# Patient Record
Sex: Male | Born: 1975 | Race: Black or African American | Hispanic: No | Marital: Single | Smoking: Former smoker
Health system: Southern US, Community
[De-identification: ages and names within clinical notes are randomized; demographics above are authoritative.]

## PROBLEM LIST (undated history)

## (undated) DIAGNOSIS — R569 Unspecified convulsions: Secondary | ICD-10-CM

## (undated) DIAGNOSIS — I10 Essential (primary) hypertension: Secondary | ICD-10-CM

## (undated) DIAGNOSIS — E119 Type 2 diabetes mellitus without complications: Secondary | ICD-10-CM

## (undated) HISTORY — DX: Type 2 diabetes mellitus without complications: E11.9

## (undated) HISTORY — DX: Essential (primary) hypertension: I10

## (undated) HISTORY — DX: Unspecified convulsions: R56.9

---

## 2017-03-20 ENCOUNTER — Encounter (HOSPITAL_COMMUNITY): Payer: Self-pay

## 2017-03-20 ENCOUNTER — Emergency Department (HOSPITAL_COMMUNITY)
Admission: EM | Admit: 2017-03-20 | Discharge: 2017-03-20 | Disposition: A | Discharge status: Home / Self Care | Payer: Self-pay | Attending: Emergency Medicine | Admitting: Emergency Medicine

## 2017-03-20 ENCOUNTER — Emergency Department (HOSPITAL_COMMUNITY): Payer: Self-pay

## 2017-03-20 DIAGNOSIS — N50811 Right testicular pain: Secondary | ICD-10-CM | POA: Insufficient documentation

## 2017-03-20 DIAGNOSIS — I10 Essential (primary) hypertension: Secondary | ICD-10-CM | POA: Insufficient documentation

## 2017-03-20 DIAGNOSIS — R350 Frequency of micturition: Secondary | ICD-10-CM | POA: Insufficient documentation

## 2017-03-20 DIAGNOSIS — R739 Hyperglycemia, unspecified: Secondary | ICD-10-CM | POA: Insufficient documentation

## 2017-03-20 DIAGNOSIS — N50819 Testicular pain, unspecified: Secondary | ICD-10-CM

## 2017-03-20 LAB — COMPREHENSIVE METABOLIC PANEL
ALBUMIN: 4.3 g/dL (ref 3.5–5.0)
ALK PHOS: 106 U/L (ref 38–126)
ALT: 36 U/L (ref 17–63)
ANION GAP: 9 (ref 5–15)
AST: 26 U/L (ref 15–41)
BILIRUBIN TOTAL: 0.7 mg/dL (ref 0.3–1.2)
BUN: 18 mg/dL (ref 6–20)
CALCIUM: 9.8 mg/dL (ref 8.9–10.3)
CO2: 29 mmol/L (ref 22–32)
Chloride: 94 mmol/L — ABNORMAL LOW (ref 101–111)
Creatinine, Ser: 1.05 mg/dL (ref 0.61–1.24)
GFR calc Af Amer: >60 mL/min (ref >60)
GLUCOSE: 346 mg/dL — AB (ref 65–99)
POTASSIUM: 4.3 mmol/L (ref 3.5–5.1)
Sodium: 132 mmol/L — ABNORMAL LOW (ref 135–145)
TOTAL PROTEIN: 7.6 g/dL (ref 6.5–8.1)

## 2017-03-20 LAB — CBC WITH DIFFERENTIAL/PLATELET
BASOS ABS: 0 10*3/uL (ref 0.0–0.1)
BASOS PCT: 1 %
EOS PCT: 1 %
Eosinophils Absolute: 0.1 10*3/uL (ref 0.0–0.7)
HCT: 46.5 % (ref 39.0–52.0)
Hemoglobin: 16.5 g/dL (ref 13.0–17.0)
LYMPHS PCT: 35 %
Lymphs Abs: 3 10*3/uL (ref 0.7–4.0)
MCH: 28.9 pg (ref 26.0–34.0)
MCHC: 35.5 g/dL (ref 30.0–36.0)
MCV: 81.6 fL (ref 78.0–100.0)
MONO ABS: 0.6 10*3/uL (ref 0.1–1.0)
Monocytes Relative: 7 %
NEUTROS ABS: 4.9 10*3/uL (ref 1.7–7.7)
Neutrophils Relative %: 56 %
PLATELETS: 191 10*3/uL (ref 150–400)
RBC: 5.7 MIL/uL (ref 4.22–5.81)
RDW: 13.4 % (ref 11.5–15.5)
WBC: 8.7 10*3/uL (ref 4.0–10.5)

## 2017-03-20 LAB — URINALYSIS, ROUTINE W REFLEX MICROSCOPIC
Bacteria, UA: NONE SEEN
Bilirubin Urine: NEGATIVE
HGB URINE DIPSTICK: NEGATIVE
KETONES UR: NEGATIVE mg/dL
LEUKOCYTES UA: NEGATIVE
NITRITE: NEGATIVE
PH: 7 (ref 5.0–8.0)
PROTEIN: NEGATIVE mg/dL
SQUAMOUS EPITHELIAL / LPF: NONE SEEN
Specific Gravity, Urine: 1.026 (ref 1.005–1.030)
WBC, UA: NONE SEEN WBC/hpf (ref 0–5)

## 2017-03-20 LAB — CBG MONITORING, ED: Glucose-Capillary: 355 mg/dL — ABNORMAL HIGH (ref 65–99)

## 2017-03-20 MED ORDER — AMLODIPINE BESYLATE 5 MG PO TABS: 5.0000 mg | ORAL_TABLET | Freq: Every day | ORAL | 1 refills | Status: DC

## 2017-03-20 MED ORDER — SODIUM CHLORIDE 0.9 % IV BOLUS (SEPSIS)
1000.0000 mL | Freq: Once | INTRAVENOUS | Status: AC
  Administered 2017-03-20: 1000 mL via INTRAVENOUS

## 2017-03-20 MED ORDER — METFORMIN HCL 500 MG PO TABS: 500.0000 mg | ORAL_TABLET | Freq: Two times a day (BID) | ORAL | 1 refills | Status: DC

## 2017-03-20 MED ORDER — AMLODIPINE BESYLATE 5 MG PO TABS
5.0000 mg | ORAL_TABLET | Freq: Once | ORAL | Status: AC
  Administered 2017-03-20: 5 mg via ORAL
  Filled 2017-03-20: qty 1

## 2017-03-20 MED ORDER — METFORMIN HCL 500 MG PO TABS
500.0000 mg | ORAL_TABLET | Freq: Once | ORAL | Status: AC
  Administered 2017-03-20: 500 mg via ORAL
  Filled 2017-03-20: qty 1

## 2017-03-20 NOTE — ED Provider Notes (Signed)
[facility]-EM[facility]ENCY DEPT Provider Note   CSN: [id number] Arrival date & time: 03/20/17  1243     History   Chief Complaint Chief Complaint  Patient presents with  . Testicle Pain    HPI Marcus Arellano is a 41 y.o. male.  HPI Patient presents with right-sided testicle pain. Comes and goes. His crampy. States it hurts in the testicle and then to the tip of his penis. No discharge. No pain with urination. States he has however been urinating frequently. States he has to get up at night with it. Denies chance of STD. No fevers. No diarrhea or constipation. No chest pain.   History reviewed. No pertinent past medical history.  There are no active problems to display for this patient.   History reviewed. No pertinent surgical history.     Home Medications    Prior to Admission medications   Not on File    Family History No family history on file.  Social History Social History  Substance Use Topics  . Smoking status: Never Smoker  . Smokeless tobacco: Never Used  . Alcohol use No     Allergies   Patient has no known allergies.   Review of Systems Review of Systems  Constitutional: Negative for appetite change.  HENT: Negative for congestion.   Respiratory: Negative for shortness of breath.   Cardiovascular: Negative for chest pain.  Gastrointestinal: Negative for abdominal pain.  Endocrine: Positive for polyuria.  Genitourinary: Positive for frequency and testicular pain. Negative for decreased urine volume, difficulty urinating, penile swelling and scrotal swelling.  Musculoskeletal: Negative for back pain.  Skin: Negative for rash.  Neurological: Negative for weakness.  Psychiatric/Behavioral: Negative for confusion.     Physical Exam Updated Vital Signs BP (!) 157/112 (BP Location: Right Arm)   Pulse 97   Temp 98.5 F (36.9 C) (Oral)   Resp 16   Ht 5\' 10"  (1.778 m)   Wt 113.4 kg (250 lb)   SpO2 95%   BMI 35.87 kg/m   Physical Exam    Constitutional: He appears well-developed.  HENT:  Head: Atraumatic.  Eyes: Pupils are equal, round, and reactive to light.  Neck: Neck supple.  Cardiovascular: Normal rate.   Pulmonary/Chest: Effort normal.  Abdominal: There is no tenderness.  Genitourinary: Penis normal. No penile tenderness.  Genitourinary Comments: No testicular tenderness. Normal lie. No tenderness over epididymis. No hernia palpated. No penile discharge or lesion.  Musculoskeletal: Normal range of motion.  Neurological: He is alert.  Skin: Skin is warm. Capillary refill takes less than 2 seconds.     ED Treatments / Results  Labs (all labs ordered are listed, but only abnormal results are displayed) Labs Reviewed  URINALYSIS, ROUTINE W REFLEX MICROSCOPIC - Abnormal; Notable for the following:       Result Value   Color, Urine STRAW (*)    Glucose, UA >=500 (*)    All other components within normal limits  CBG MONITORING, ED - Abnormal; Notable for the following:    Glucose-Capillary 355 (*)    All other components within normal limits  COMPREHENSIVE METABOLIC PANEL  CBC WITH DIFFERENTIAL/PLATELET  HEMOGLOBIN A1C    EKG  EKG Interpretation None       Radiology No results found.  Procedures Procedures (including critical care time)  Medications Ordered in ED Medications  sodium chloride 0.9 % bolus 1,000 mL (not administered)     Initial Impression / Assessment and Plan / ED Course  I have reviewed the triage vital  signs and the nursing notes.  Pertinent labs & imaging results that were available during my care of the patient were reviewed by me and considered in my medical decision making (see chart for details).     Patient with testicle pain. No dysuria. Urine reassuring. Denies chance of STD. Ultrasound done to evaluate for torsion or infection but still pending. Also hyperglycemia. States she's been urinating frequently. Also hypertension. Will likely need primary care follow-up  and may require metformin restarted. Care will be turned over to Dr. Clayborne DanaMesner.  Final Clinical Impressions(s) / ED Diagnoses   Final diagnoses:  Testicular torsion  Hyperglycemia  Hypertension, unspecified type  Testicle pain    New Prescriptions New Prescriptions   No medications on file     Benjiman CorePickering, Dewitte Vannice, MD 03/20/17 1504

## 2017-03-20 NOTE — ED Triage Notes (Signed)
Patient reports of right testicle pain that radiates to tip of penis x2 days. Reports of urinary frequency.

## 2017-03-21 LAB — HEMOGLOBIN A1C
Hgb A1c MFr Bld: 10.7 % — ABNORMAL HIGH (ref 4.8–5.6)
MEAN PLASMA GLUCOSE: 260 mg/dL

## 2017-07-02 IMAGING — US US SCROTUM
1 series · 14 of 25 positions shown · non-contrast
Comparison: None.

CLINICAL DATA: Two days of right testicle pain.  Question torsion.

EXAM:
SCROTAL ULTRASOUND
DOPPLER ULTRASOUND OF THE TESTICLES
TECHNIQUE: Complete ultrasound examination of the testicles, epididymis, and
other scrotal structures was performed. Color and spectral Doppler
ultrasound were also utilized to evaluate blood flow to the
testicles.

[Series 1: us scrotum · 0.06mm/px · 14 of 77 slices shown]
[im 1/77]
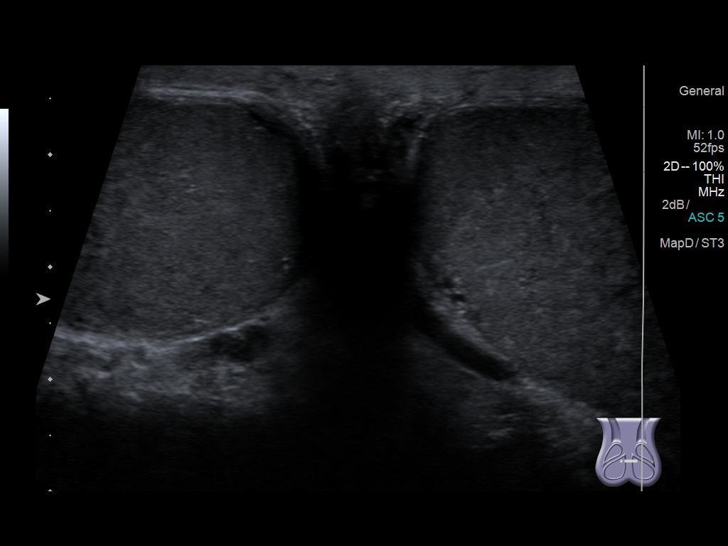
[im 7/77]
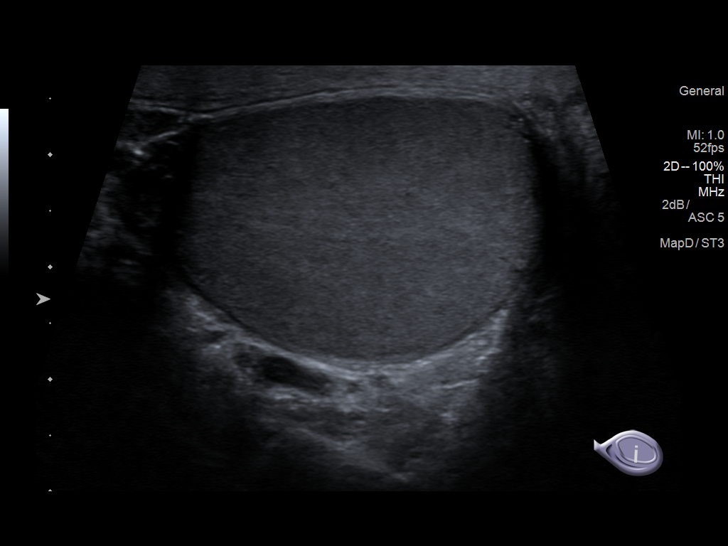
[im 13/77]
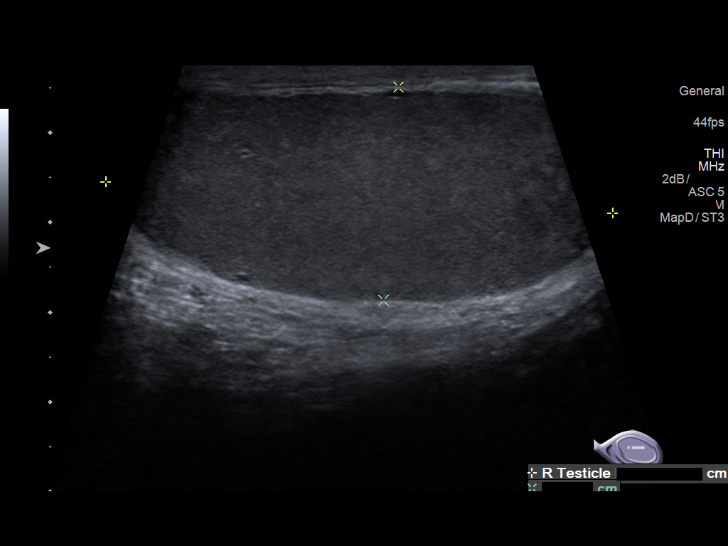
[im 20/77]
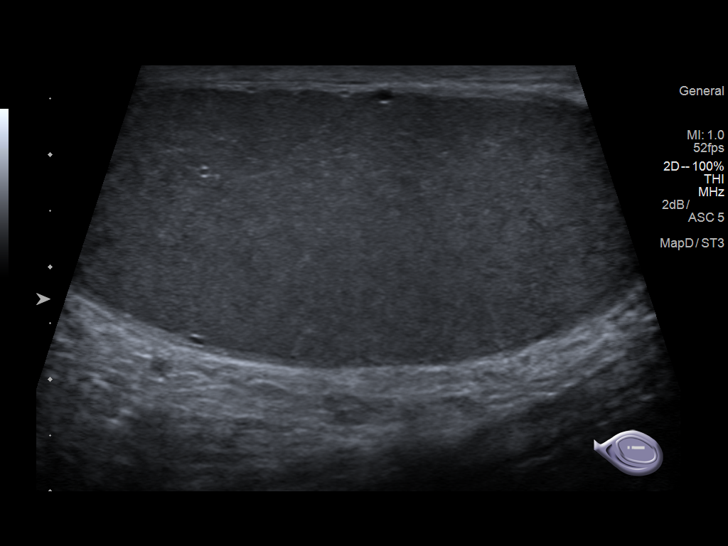
[im 26/77]
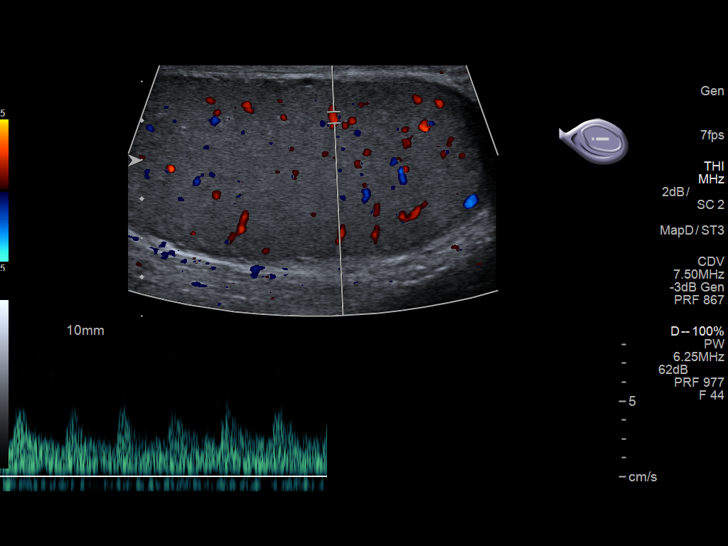
[im 29/77]
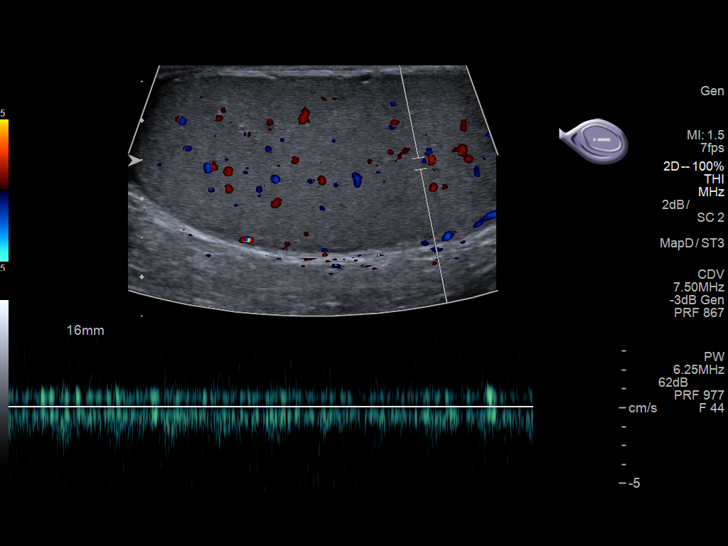
[im 35/77]
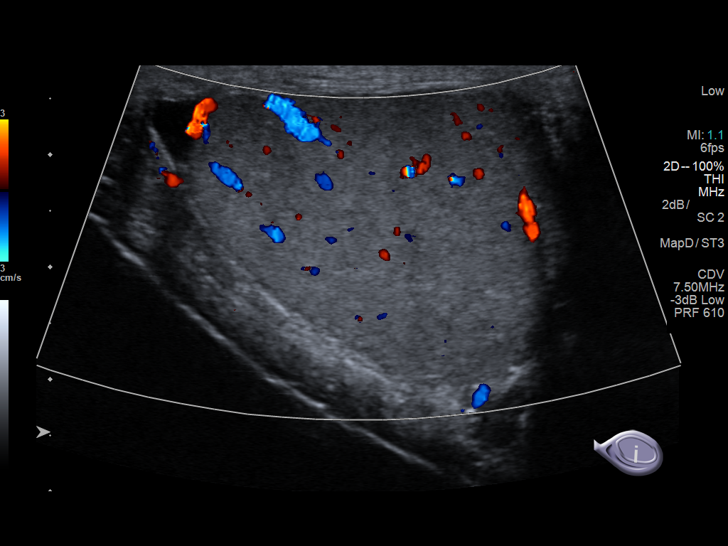
[im 42/77]
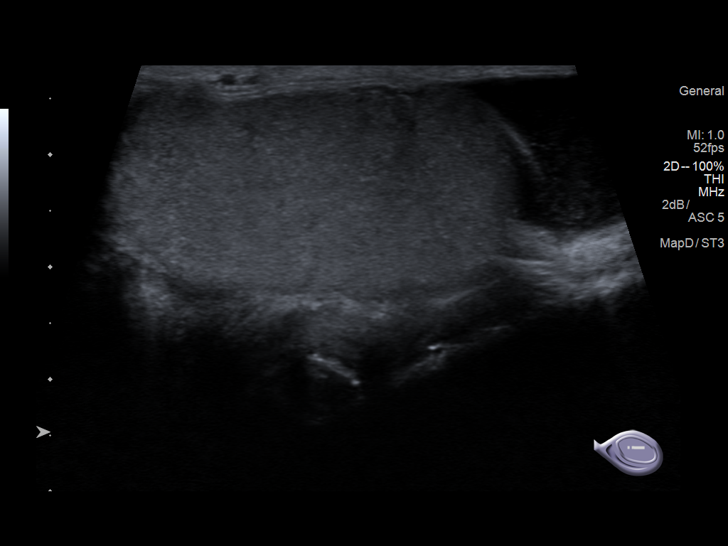
[im 48/77]
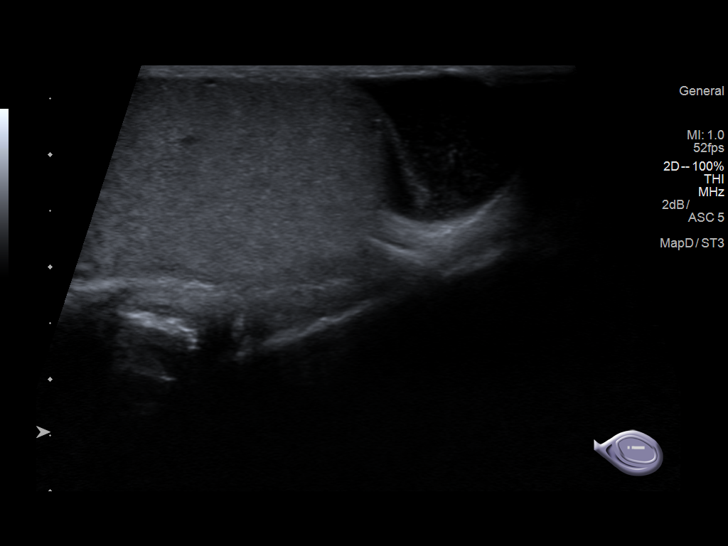
[im 51/77]
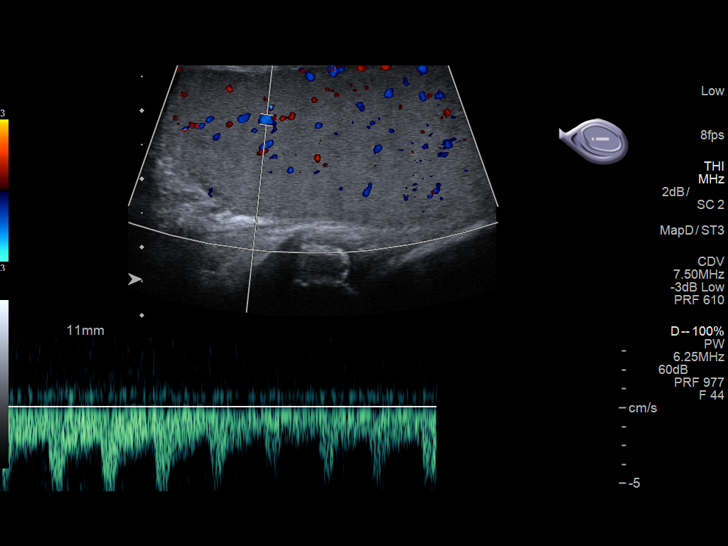
[im 58/77]
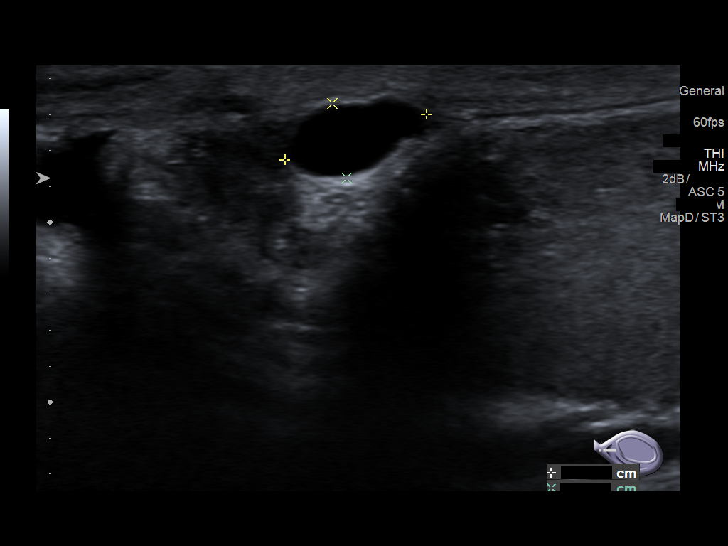
[im 64/77]
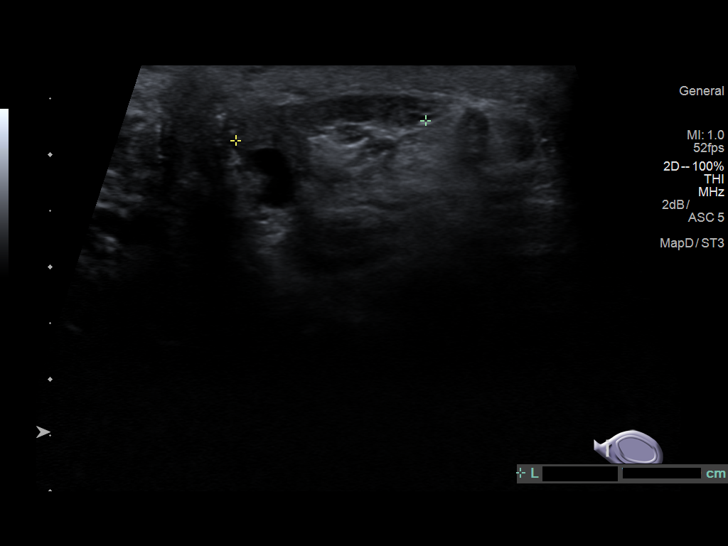
[im 70/77]
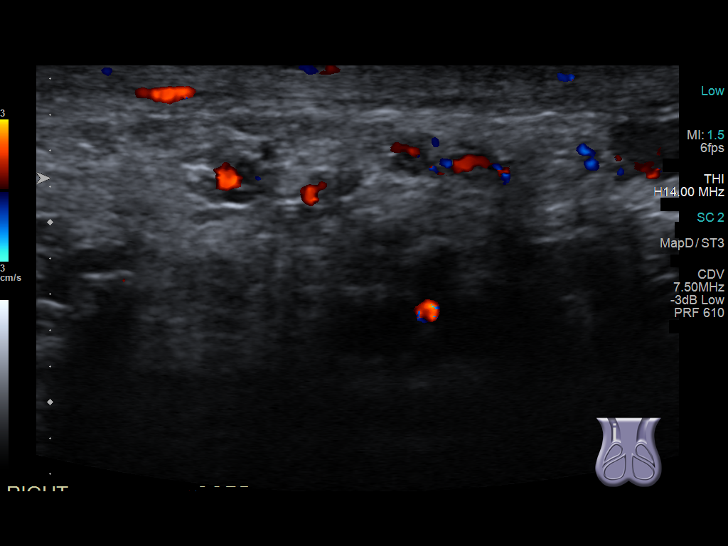
[im 77/77]
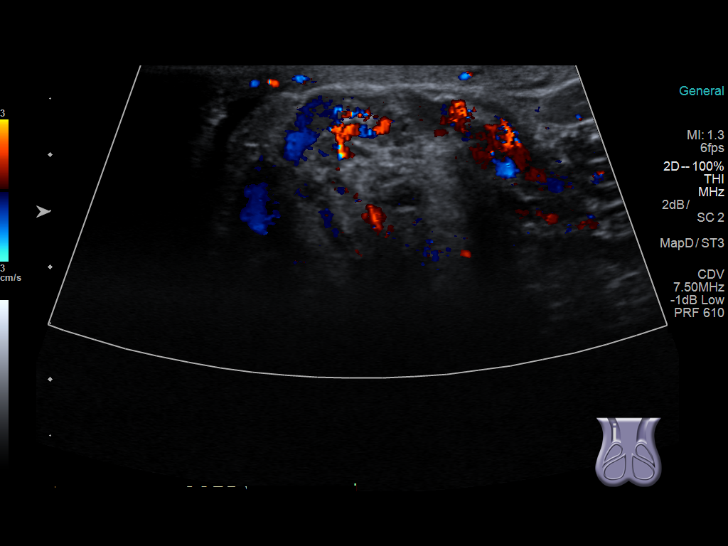

[14 of 25 positions shown; findings below may reference images not displayed]

FINDINGS: Pulsed Doppler interrogation of both testes demonstrates normal low
resistance arterial and venous waveforms bilaterally.

Measurements: 5.7 x 2.4 x 3.4 cm. No mass or microlithiasis
visualized.

Left testicle

Measurements: 5.4 x 2.4 x 3.3 cm. No mass or microlithiasis
visualized.

Right epididymis: Right-sided simple cyst measuring 8 mm. Otherwise
normal in size and appearance.

Left epididymis:  Normal in size and appearance.

Hydrocele:  Mild bilateral hydrocele.
IMPRESSION: Negative. No evidence for testicular torsion at this time or other
significant abnormality.

By: Tello Saleem M.D.

## 2017-08-07 DIAGNOSIS — E119 Type 2 diabetes mellitus without complications: Secondary | ICD-10-CM

## 2017-08-07 LAB — GLUCOSE, POCT (MANUAL RESULT ENTRY): POC Glucose: 288 mg/dl — AB (ref 70–99)

## 2017-08-07 LAB — POCT GLYCOSYLATED HEMOGLOBIN (HGB A1C): Hemoglobin A1C: 10.4

## 2017-08-07 NOTE — Congregational Nurse Program (Signed)
Congregational Nurse Program Note  Date of Encounter: 08/07/2017  Past Medical History: No past medical history on file.  Encounter Details: CNP Questionnaire - 08/07/17 1600      Questionnaire   Patient Status  Not Applicable    Race  Black or African American    Location Patient Served At  [facility]    Insurance  Not Applicable    Uninsured  Uninsured (NEW 1x/quarter)    Food  No food insecurities    Housing/Utilities  Yes, have permanent housing    Transportation  No transportation needs    Interpersonal Safety  Yes, feel physically and emotionally safe where you currently live    Medication  Provided medication assistance    Medical Provider  No    Referrals  [facility];Other;Primary Care Provider/Clinic    ED Visit Averted  Not Applicable    Life-Saving Intervention Made  Not Applicable       [facility]. Client is here today seeking help navigating into a primary care provider. He currently is unemployed and has no insurance. He lives with his sister and has for a year now. He was in the emergency room about 4 months ago and was treated for hypertension and diabetes with Metformin 500 mg one twice daily and amlodipine 5 mg one daily. He states he only had a month's worth and as been out of them for about 3 months. He states [place] to moving here from [place], he had a medical provider but he didn't follow up as he should.  No past medical history from client No past surgical history  Client alert and oriented to person and place. Answers questions appropriately. Does not appear anxious or nervous. Denies chest pains, shortness of breath or headaches. Denies any vision changes. States he has been feeling fine, however he does report being up frequently at night to urinate and increased hunger and thirst.  He denies abdominal pain. He states that he knows he needs to get back on medications.  Discussed with client options for primary  medical care and client would like a referral to the free clinic. Referral made and secured for 08/09/17 at 1030 am. RN also discussed with client using [facility] for a possible MD consult and refill of the medications he had been on previously. Client agreeable. [facility] session done with Dr Annia FriendlyBeard. Vitals and Point of care labs given to Dr. Annia FriendlyBeard along with medications he had been on and that the client will establish a primary medical provider with an upcoming appointment. Dr. Annia FriendlyBeard E-prescribed Metformin 500 mg po one tablet by mouth twice daily and amlodpine 5 mg tablet one by mouth daily. Client instructed to pick up medications today at [facility] on [address] in [place] and begin taking them today.  Client states understanding. One time assistance voucher faxed to [facility] to assist in securing medications.  RN counseled client on decreasing salt intake as well as information regarding a diabetic diet. Handouts given regarding low salt diet, diabetes, my plate example. Tips on stroke prevention and healthy lifestyle and diet.  Will follow up with client after his initial primary care appointment and for further needs. Client has contact information for the free clinic as well as RN and [facility].

## 2017-08-09 ENCOUNTER — Ambulatory Visit: Payer: Self-pay | Admitting: Physician Assistant

## 2017-08-09 ENCOUNTER — Encounter: Payer: Self-pay | Admitting: Physician Assistant

## 2017-08-09 ENCOUNTER — Other Ambulatory Visit: Payer: Self-pay | Admitting: Physician Assistant

## 2017-08-09 VITALS — BP 146/118 | HR 90 | Temp 97.3°F | Ht 69.25 in | Wt 260.5 lb

## 2017-08-09 DIAGNOSIS — I10 Essential (primary) hypertension: Secondary | ICD-10-CM | POA: Insufficient documentation

## 2017-08-09 DIAGNOSIS — R079 Chest pain, unspecified: Secondary | ICD-10-CM

## 2017-08-09 DIAGNOSIS — E669 Obesity, unspecified: Secondary | ICD-10-CM

## 2017-08-09 DIAGNOSIS — E1165 Type 2 diabetes mellitus with hyperglycemia: Secondary | ICD-10-CM | POA: Insufficient documentation

## 2017-08-09 DIAGNOSIS — Z13220 Encounter for screening for lipoid disorders: Secondary | ICD-10-CM

## 2017-08-09 MED ORDER — METOPROLOL TARTRATE 25 MG PO TABS: 25.0000 mg | ORAL_TABLET | Freq: Two times a day (BID) | ORAL | 1 refills | Status: DC

## 2017-08-09 MED ORDER — LISINOPRIL 10 MG PO TABS: 10.0000 mg | ORAL_TABLET | Freq: Every day | ORAL | 1 refills | Status: DC

## 2017-08-09 NOTE — Progress Notes (Signed)
BP (!) 146/118 (BP Location: Left Arm, Patient Position: Sitting, Cuff Size: Large)   Pulse 90   Temp (!) 97.3 F (36.3 C)   Ht 5' 9.25" (1.759 m)   Wt 260 lb 8 oz (118.2 kg)   SpO2 98%   BMI 38.19 kg/m    Subjective:    Patient ID: Marcus Arellano, male    DOB: 1976-06-01, 41 y.o.   MRN: [id number]  HPI: Marcus Arellano is a 41 y.o. male presenting on 08/09/2017 for New Patient (Initial Visit) (pt has not seen provide in a long time. [facility])   HPI  Chief Complaint  Patient presents with  . New Patient (Initial Visit)    pt has not seen provide in a long time. pt got meds from [facility]    Pt moved to the area about a year ago and no PCP since then  He was Just dx with DM and htn this summer in the ER.  a1c recently 10.4.   He sometimes gets CP with exertion.  No CP at rest.  He gets the pain maybe once/week. It lasts about 5 min but goes away when he rests.  No associated sob or nausea.      He is sweaty all the time, not just with CP.  He has a family history of CAD    Relevant past medical, surgical, family and social history reviewed and updated as indicated. Interim medical history since our last visit reviewed. Allergies and medications reviewed and updated.   Current Outpatient Medications:  .  amLODipine (NORVASC) 5 MG tablet, Take 1 tablet (5 mg total) by mouth daily., Disp: 30 tablet, Rfl: 1 .  metFORMIN (GLUCOPHAGE) 500 MG tablet, Take 1 tablet (500 mg total) by mouth 2 (two) times daily with a meal., Disp: 60 tablet, Rfl: 1   Review of Systems  Constitutional: Positive for diaphoresis. Negative for appetite change, chills, fatigue, fever and unexpected weight change.  HENT: Positive for dental problem. Negative for congestion, drooling, ear pain, facial swelling, hearing loss, mouth sores, sneezing, sore throat, trouble swallowing and voice change.   Eyes: Negative for pain, discharge, redness, itching and visual disturbance.   Respiratory: Negative for cough, choking, shortness of breath and wheezing.   Cardiovascular: Positive for chest pain and leg swelling. Negative for palpitations.  Gastrointestinal: Negative for abdominal pain, blood in stool, constipation, diarrhea and vomiting.  Endocrine: Negative for cold intolerance, heat intolerance and polydipsia.  Genitourinary: Negative for decreased urine volume, dysuria and hematuria.  Musculoskeletal: Negative for arthralgias, back pain and gait problem.  Skin: Negative for rash.  Allergic/Immunologic: Negative for environmental allergies.  Neurological: Positive for light-headedness and headaches. Negative for seizures and syncope.  Hematological: Negative for adenopathy.  Psychiatric/Behavioral: Positive for dysphoric mood. Negative for agitation and suicidal ideas. The patient is not nervous/anxious.     Per HPI unless specifically indicated above     Objective:    BP (!) 146/118 (BP Location: Left Arm, Patient Position: Sitting, Cuff Size: Large)   Pulse 90   Temp (!) 97.3 F (36.3 C)   Ht 5' 9.25" (1.759 m)   Wt 260 lb 8 oz (118.2 kg)   SpO2 98%   BMI 38.19 kg/m   Wt Readings from Last 3 Encounters:  08/09/17 260 lb 8 oz (118.2 kg)  08/07/17 261 lb (118.4 kg)  03/20/17 250 lb (113.4 kg)    Physical Exam  Constitutional: He is oriented to person, place,  and time. He appears well-developed and well-nourished.  HENT:  Head: Normocephalic and atraumatic.  Mouth/Throat: Oropharynx is clear and moist. No oropharyngeal exudate.  Eyes: Conjunctivae and EOM are normal. Pupils are equal, round, and reactive to light.  Neck: Neck supple. No thyromegaly present.  Cardiovascular: Normal rate and regular rhythm.  Pulmonary/Chest: Effort normal and breath sounds normal. He has no wheezes. He has no rales.  Abdominal: Soft. Bowel sounds are normal. He exhibits no mass. There is no hepatosplenomegaly. There is no tenderness.  Musculoskeletal: He exhibits  no edema.  Lymphadenopathy:    He has no cervical adenopathy.  Neurological: He is alert and oriented to person, place, and time.  Skin: Skin is warm and dry. No rash noted.  Psychiatric: He has a normal mood and affect. His behavior is normal. Thought content normal.  Vitals reviewed.    EKG- NSR at 89 bpm.  No st-t changes. No previous for comparison  Results for orders placed or performed in visit on 08/07/17  POCT glucose  Result Value Ref Range   POC Glucose 288 (A) 70 - 99 mg/dl  POCT [id number]  Result Value Ref Range   Hemoglobin A1C 10.4       Assessment & Plan:   Encounter Diagnoses  Name Primary?  Marland Kitchen. Uncontrolled type 2 diabetes mellitus with hyperglycemia (HCC) Yes  . Essential hypertension   . Chest pain, unspecified type   . Screening cholesterol level   . Obesity, unspecified classification, unspecified obesity type, unspecified whether serious comorbidity present     -pt to get fasting labs -pt to Go to DM education class -will add [place].  Samples are given in office today -pt is signed up for [facility] (to get the [place]) -pt will continue the metformin -will Add lisinopril and metoprolol for bp and discontinue the amlodipine -pt is to start low-dose aspirin daily -will Refer to cardiology for evaluation of CP -pt is given [facility] application -pt will follow up here in 1 month.  RTO sooner prn

## 2017-08-09 NOTE — Patient Instructions (Addendum)
-  get labs/blood drawn -go to diabetes education class -stop amlodipine -start lisinopril and metoprolol (BP) -continue metformin (DM) -januvia (DM) will come in the mail -start daily 81 mg aspirin -[facility] in [facility] application

## 2017-08-10 ENCOUNTER — Other Ambulatory Visit (HOSPITAL_COMMUNITY)
Admission: RE | Admit: 2017-08-10 | Discharge: 2017-08-10 | Disposition: A | Discharge status: Home / Self Care | Payer: Self-pay | Source: Ambulatory Visit | Attending: Physician Assistant | Admitting: Physician Assistant

## 2017-08-10 DIAGNOSIS — I10 Essential (primary) hypertension: Secondary | ICD-10-CM | POA: Insufficient documentation

## 2017-08-10 DIAGNOSIS — Z13220 Encounter for screening for lipoid disorders: Secondary | ICD-10-CM | POA: Insufficient documentation

## 2017-08-10 DIAGNOSIS — R079 Chest pain, unspecified: Secondary | ICD-10-CM | POA: Insufficient documentation

## 2017-08-10 DIAGNOSIS — E1165 Type 2 diabetes mellitus with hyperglycemia: Secondary | ICD-10-CM | POA: Insufficient documentation

## 2017-08-10 LAB — COMPREHENSIVE METABOLIC PANEL
ALBUMIN: 3.9 g/dL (ref 3.5–5.0)
ALK PHOS: 95 U/L (ref 38–126)
ALT: 25 U/L (ref 17–63)
AST: 26 U/L (ref 15–41)
Anion gap: 7 (ref 5–15)
BUN: 16 mg/dL (ref 6–20)
CHLORIDE: 98 mmol/L — AB (ref 101–111)
CO2: 28 mmol/L (ref 22–32)
CREATININE: 0.94 mg/dL (ref 0.61–1.24)
Calcium: 9 mg/dL (ref 8.9–10.3)
GFR calc Af Amer: >60 mL/min (ref >60)
Glucose, Bld: 240 mg/dL — ABNORMAL HIGH (ref 65–99)
Potassium: 4.1 mmol/L (ref 3.5–5.1)
Sodium: 133 mmol/L — ABNORMAL LOW (ref 135–145)
Total Bilirubin: 0.6 mg/dL (ref 0.3–1.2)
Total Protein: 7.4 g/dL (ref 6.5–8.1)

## 2017-08-10 LAB — HEMOGLOBIN AND HEMATOCRIT, BLOOD
HCT: 46.1 % (ref 39.0–52.0)
HEMOGLOBIN: 16.1 g/dL (ref 13.0–17.0)

## 2017-08-10 LAB — LIPID PANEL
Cholesterol: 204 mg/dL — ABNORMAL HIGH (ref 0–200)
HDL: 34 mg/dL — AB (ref >40)
LDL CALC: 146 mg/dL — AB (ref 0–99)
TRIGLYCERIDES: 119 mg/dL (ref <150)
Total CHOL/HDL Ratio: 6 RATIO
VLDL: 24 mg/dL (ref 0–40)

## 2017-08-11 LAB — MICROALBUMIN, URINE: Microalb, Ur: 221.9 ug/mL — ABNORMAL HIGH

## 2017-08-21 ENCOUNTER — Telehealth: Payer: Self-pay

## 2017-08-21 NOTE — Telephone Encounter (Signed)
[facility] on 08/07/2017 for assistance navigating into a healthcare system. Pt opted for the [facility] and an appointment was secured for 08/09/2017 at 10:30 am.   Pt was called today for follow-up. Pt stated he is doing good and was able to make it to his appointment and has also been to get his blood drawn for his lab work.  Pt appreciative of the call.   Marcus Blanda R. DeSotoRangel, LPN [phone]

## 2017-09-06 ENCOUNTER — Ambulatory Visit: Payer: Self-pay | Admitting: Physician Assistant

## 2017-09-10 ENCOUNTER — Ambulatory Visit: Payer: Self-pay | Admitting: Physician Assistant

## 2017-09-11 ENCOUNTER — Ambulatory Visit: Payer: Self-pay | Admitting: Physician Assistant

## 2017-09-11 ENCOUNTER — Encounter: Payer: Self-pay | Admitting: Physician Assistant

## 2017-09-11 VITALS — BP 150/100 | HR 101 | Ht 69.25 in | Wt 260.5 lb

## 2017-09-11 DIAGNOSIS — E669 Obesity, unspecified: Secondary | ICD-10-CM

## 2017-09-11 DIAGNOSIS — Z9119 Patient's noncompliance with other medical treatment and regimen: Secondary | ICD-10-CM

## 2017-09-11 DIAGNOSIS — E785 Hyperlipidemia, unspecified: Secondary | ICD-10-CM | POA: Insufficient documentation

## 2017-09-11 DIAGNOSIS — Z91199 Patient's noncompliance with other medical treatment and regimen due to unspecified reason: Secondary | ICD-10-CM

## 2017-09-11 DIAGNOSIS — E1165 Type 2 diabetes mellitus with hyperglycemia: Secondary | ICD-10-CM

## 2017-09-11 DIAGNOSIS — I10 Essential (primary) hypertension: Secondary | ICD-10-CM

## 2017-09-11 MED ORDER — LISINOPRIL 10 MG PO TABS: 10.0000 mg | ORAL_TABLET | Freq: Every day | ORAL | 1 refills | Status: DC

## 2017-09-11 MED ORDER — ATORVASTATIN CALCIUM 20 MG PO TABS: 20.0000 mg | ORAL_TABLET | Freq: Every day | ORAL | 3 refills | Status: DC

## 2017-09-11 MED ORDER — METFORMIN HCL 500 MG PO TABS: 500.0000 mg | ORAL_TABLET | Freq: Two times a day (BID) | ORAL | 1 refills | Status: DC

## 2017-09-11 MED ORDER — METOPROLOL TARTRATE 25 MG PO TABS: 25.0000 mg | ORAL_TABLET | Freq: Two times a day (BID) | ORAL | 1 refills | Status: DC

## 2017-09-11 NOTE — Patient Instructions (Signed)
Fat and Cholesterol Restricted Diet High levels of fat and cholesterol in your blood may lead to various health problems, such as diseases of the heart, blood vessels, gallbladder, liver, and pancreas. Fats are concentrated sources of energy that come in various forms. Certain types of fat, including saturated fat, may be harmful in excess. Cholesterol is a substance needed by your body in small amounts. Your body makes all the cholesterol it needs. Excess cholesterol comes from the food you eat. When you have high levels of cholesterol and saturated fat in your blood, health problems can develop because the excess fat and cholesterol will gather along the walls of your blood vessels, causing them to narrow. Choosing the right foods will help you control your intake of fat and cholesterol. This will help keep the levels of these substances in your blood within normal limits and reduce your risk of disease. What is my plan? Your health care provider recommends that you:  Limit your fat intake to ______% or less of your total calories per day.  Limit the amount of cholesterol in your diet to less than _________mg per day.  Eat 20-30 grams of fiber each day.  What types of fat should I choose?  Choose healthy fats more often. Choose monounsaturated and polyunsaturated fats, such as olive and canola oil, flaxseeds, walnuts, almonds, and seeds.  Eat more omega-3 fats. Good choices include salmon, mackerel, sardines, tuna, flaxseed oil, and ground flaxseeds. Aim to eat fish at least two times a week.  Limit saturated fats. Saturated fats are primarily found in animal products, such as meats, butter, and cream. Plant sources of saturated fats include palm oil, palm kernel oil, and coconut oil.  Avoid foods with partially hydrogenated oils in them. These contain trans fats. Examples of foods that contain trans fats are stick margarine, some tub margarines, cookies, crackers, and other baked goods. What  general guidelines do I need to follow? These guidelines for healthy eating will help you control your intake of fat and cholesterol:  Check food labels carefully to identify foods with trans fats or high amounts of saturated fat.  Fill one half of your plate with vegetables and green salads.  Fill one fourth of your plate with whole grains. Look for the word "whole" as the first word in the ingredient list.  Fill one fourth of your plate with lean protein foods.  Limit fruit to two servings a day. Choose fruit instead of juice.  Eat more foods that contain fiber, such as apples, broccoli, carrots, beans, peas, and barley.  Eat more home-cooked food and less restaurant, buffet, and fast food.  Limit or avoid alcohol.  Limit foods high in starch and sugar.  Limit fried foods.  Cook foods using methods other than frying. Baking, boiling, grilling, and broiling are all great options.  Lose weight if you are overweight. Losing just 5-10% of your initial body weight can help your overall health and prevent diseases such as diabetes and heart disease.  What foods can I eat? Grains  Whole grains, such as whole wheat or whole grain breads, crackers, cereals, and pasta. Unsweetened oatmeal, bulgur, barley, quinoa, or [place] rice. Corn or whole wheat flour tortillas. Vegetables  Fresh or frozen vegetables (raw, steamed, roasted, or grilled). Green salads. Fruits  All fresh, canned (in natural juice), or frozen fruits. Meats and other protein foods  Ground beef (85% or leaner), grass-fed beef, or beef trimmed of fat. Skinless chicken or turkey. Ground chicken or turkey.   Pork trimmed of fat. All fish and seafood. Eggs. Dried beans, peas, or lentils. Unsalted nuts or seeds. Unsalted canned or dry beans. Dairy  Low-fat dairy products, such as skim or 1% milk, 2% or reduced-fat cheeses, low-fat ricotta or cottage cheese, or plain low-fat yo Fats and oils  Tub margarines without trans  fats. Light or reduced-fat mayonnaise and salad dressings. Avocado. Olive, canola, sesame, or safflower oils. Natural peanut or almond butter (choose ones without added sugar and oil). The items listed above may not be a complete list of recommended foods or beverages. Contact your dietitian for more options. Foods to avoid Grains  White bread. White pasta. White rice. Cornbread. Bagels, pastries, and croissants. Crackers that contain trans fat. Vegetables  White potatoes. Corn. Creamed or fried vegetables. Vegetables in a cheese sauce. Fruits  Dried fruits. Canned fruit in light or heavy syrup. Fruit juice. Meats and other protein foods  Fatty cuts of meat. Ribs, chicken wings, bacon, sausage, bologna, salami, chitterlings, fatback, hot dogs, bratwurst, and packaged luncheon meats. Liver and organ meats. Dairy  Whole or 2% milk, cream, half-and-half, and cream cheese. Whole milk cheeses. Whole-fat or sweetened yogurt. Full-fat cheeses. Nondairy creamers and whipped toppings. Processed cheese, cheese spreads, or cheese curds. Beverages  Alcohol. Sweetened drinks (such as sodas, lemonade, and fruit drinks or punches). Fats and oils  Butter, stick margarine, lard, shortening, ghee, or bacon fat. Coconut, palm kernel, or palm oils. Sweets and desserts  Corn syrup, sugars, honey, and molasses. Candy. Jam and jelly. Syrup. Sweetened cereals. Cookies, pies, cakes, donuts, muffins, and ice cream. The items listed above may not be a complete list of foods and beverages to avoid. Contact your dietitian for more information. This information is not intended to replace advice given to you by your health care provider. Make sure you discuss any questions you have with your health care provider. Document Released: 09/18/2005 Document Revised: 10/09/2014 Document Reviewed: 12/17/2013 Elsevier Interactive Patient Education  2017 [employer]  

## 2017-09-11 NOTE — Progress Notes (Signed)
BP (!) 150/100 (BP Location: Left Arm, Patient Position: Sitting, Cuff Size: Large)   Pulse (!) 101   Ht 5' 9.25" (1.759 m)   Wt 260 lb 8 oz (118.2 kg)   SpO2 94%   BMI 38.19 kg/m    Subjective:    Patient ID: Marcus Arellano, male    DOB: 1975-11-15, 41 y.o.   MRN: [id number]  HPI: Marcus Arellano is a 41 y.o. male presenting on 09/11/2017 for Follow-up   HPI   Pt is out of  his lisinopril-   Pt checks his bs at home- a few times it was 280, once it was 300.  On a good day it is around 160-180 range.   Pt has not yet been to the diabetic education class.    Pt says he has not had any episodes of CP since last OV.   Pt has appt with cardiology next week.   He says he is supposed to go for training on next week.   Encouraged him to reschedule (and not just no-show)  Pt did not turn in his [employer] discount application.    Relevant past medical, surgical, family and social history reviewed and updated as indicated. Interim medical history since our last visit reviewed. Allergies and medications reviewed and updated.   Current Outpatient Medications:  .  metFORMIN (GLUCOPHAGE) 500 MG tablet, Take 1 tablet (500 mg total) by mouth 2 (two) times daily with a meal., Disp: 60 tablet, Rfl: 1 .  metoprolol tartrate (LOPRESSOR) 25 MG tablet, Take 1 tablet (25 mg total) 2 (two) times daily by mouth., Disp: 60 tablet, Rfl: 1 .  BABY ASPIRIN PO, Take by mouth., Disp: , Rfl:  .  lisinopril (PRINIVIL,ZESTRIL) 10 MG tablet, Take 1 tablet (10 mg total) daily by mouth. (Patient not taking: Reported on 09/11/2017), Disp: 30 tablet, Rfl: 1 .  sitaGLIPtin (JANUVIA) 100 MG tablet, Take 100 mg by mouth daily., Disp: , Rfl:    Review of Systems  Constitutional: Negative for appetite change, chills, diaphoresis, fatigue, fever and unexpected weight change.  HENT: Negative for congestion, dental problem, drooling, ear pain, facial swelling, hearing loss, mouth sores, sneezing, sore throat, trouble  swallowing and voice change.   Eyes: Negative for pain, discharge, redness, itching and visual disturbance.  Respiratory: Negative for cough, choking, shortness of breath and wheezing.   Cardiovascular: Negative for chest pain, palpitations and leg swelling.  Gastrointestinal: Negative for abdominal pain, blood in stool, constipation, diarrhea and vomiting.  Endocrine: Negative for cold intolerance, heat intolerance and polydipsia.  Genitourinary: Negative for decreased urine volume, dysuria and hematuria.  Musculoskeletal: Negative for arthralgias, back pain and gait problem.  Skin: Negative for rash.  Allergic/Immunologic: Negative for environmental allergies.  Neurological: Positive for headaches. Negative for seizures, syncope and light-headedness.  Hematological: Negative for adenopathy.  Psychiatric/Behavioral: Negative for agitation, dysphoric mood and suicidal ideas. The patient is not nervous/anxious.     Per HPI unless specifically indicated above     Objective:    BP (!) 150/100 (BP Location: Left Arm, Patient Position: Sitting, Cuff Size: Large)   Pulse (!) 101   Ht 5' 9.25" (1.759 m)   Wt 260 lb 8 oz (118.2 kg)   SpO2 94%   BMI 38.19 kg/m   Wt Readings from Last 3 Encounters:  09/11/17 260 lb 8 oz (118.2 kg)  08/09/17 260 lb 8 oz (118.2 kg)  08/07/17 261 lb (118.4 kg)    Physical Exam  Constitutional: He is oriented  to person, place, and time. He appears well-developed and well-nourished.  HENT:  Head: Normocephalic and atraumatic.  Neck: Neck supple.  Cardiovascular: Normal rate and regular rhythm.  Pulmonary/Chest: Effort normal and breath sounds normal. He has no wheezes.  Abdominal: Soft. Bowel sounds are normal. There is no hepatosplenomegaly. There is no tenderness.  Musculoskeletal: He exhibits no edema.  Lymphadenopathy:    He has no cervical adenopathy.  Neurological: He is alert and oriented to person, place, and time.  Skin: Skin is warm and dry.   Psychiatric: He has a normal mood and affect. His behavior is normal.  Vitals reviewed.   Results for orders placed or performed during the hospital encounter of 08/10/17  Microalbumin, urine  Result Value Ref Range   Microalb, Ur 221.9 (H) Not Estab. ug/mL  Lipid panel  Result Value Ref Range   Cholesterol 204 (H) 0 - 200 mg/dL   Triglycerides 130119 <865<150 mg/dL   HDL 34 (L) >78>40 mg/dL   Total CHOL/HDL Ratio 6.0 RATIO   VLDL 24 0 - 40 mg/dL   LDL Cholesterol 469146 (H) 0 - 99 mg/dL  Hemoglobin and hematocrit, blood  Result Value Ref Range   Hemoglobin 16.1 13.0 - 17.0 g/dL   HCT 62.946.1 52.839.0 - 41.352.0 %  Comprehensive metabolic panel  Result Value Ref Range   Sodium 133 (L) 135 - 145 mmol/L   Potassium 4.1 3.5 - 5.1 mmol/L   Chloride 98 (L) 101 - 111 mmol/L   CO2 28 22 - 32 mmol/L   Glucose, Bld 240 (H) 65 - 99 mg/dL   BUN 16 6 - 20 mg/dL   Creatinine, Ser 2.440.94 0.61 - 1.24 mg/dL   Calcium 9.0 8.9 - 01.010.3 mg/dL   Total Protein 7.4 6.5 - 8.1 g/dL   Albumin 3.9 3.5 - 5.0 g/dL   AST 26 15 - 41 U/L   ALT 25 17 - 63 U/L   Alkaline Phosphatase 95 38 - 126 U/L   Total Bilirubin 0.6 0.3 - 1.2 mg/dL   GFR calc non Af Amer >60 >60 mL/min   GFR calc Af Amer >60 >60 mL/min   Anion gap 7 5 - 15      Assessment & Plan:   Encounter Diagnoses  Name Primary?  Marland Kitchen. Uncontrolled type 2 diabetes mellitus with hyperglycemia (HCC) Yes  . Essential hypertension   . Hyperlipidemia, unspecified hyperlipidemia type   . Personal history of noncompliance with medical treatment, presenting hazards to health   . Obesity, unspecified classification, unspecified obesity type, unspecified whether serious comorbidity present     -Reviewed labs with pt -Pt counseled to avoid running out of meds -Pt out of [place] (he was given samples at last OV). - he needs to bring in his papers for medassist as he cannot afford to pay for the med out of pocket -pt urged to attend Dm education class -Pt counseled to turn in  [employer] discount application -add atorvastatin for cholesterol and counseled on lowfat diet -follow up 70month to recheck bp. RTO sooner prn

## 2017-09-11 NOTE — Progress Notes (Deleted)
Cardiology Office Note   Date:  09/11/2017   ID:  Marcus Arellano, DOB 1976-05-22, MRN [id number]  PCP:  Jacquelin HawkingMcElroy, Shannon, PA-C  Cardiologist:   Charlton HawsPeter Billy Rocco, MD   No chief complaint on file.     History of Present Illness: Marcus MollyDeshawn Hyams is a 41 y.o. male who presents for consultation regarding chest pain. Referred by Jacquelin HawkingShannon McElroy PA-C [facility] .  CRF;s include DM and HTN.  And Family history of premature CAD. This summer ER visit A1c 10.4 Chest pain with exertion. Lasts 5 minutes then goes away with rest. Associated with diaphoresis but no dyspnea palpitations or syncope. Has noted over the last few months . Januvia added for BS 08/09/17 BP  meds adjusted with addition of ACE/BB and stopping norvasc     Past Medical History:  Diagnosis Date  . Diabetes mellitus without complication (HCC)    onset 2018  . Hypertension   . Seizures [facility](HCC) age 83   during childhood    No past surgical history on file.   Current Outpatient Medications  Medication Sig Dispense Refill  . amLODipine (NORVASC) 5 MG tablet Take 1 tablet (5 mg total) by mouth daily. 30 tablet 1  . lisinopril (PRINIVIL,ZESTRIL) 10 MG tablet Take 1 tablet (10 mg total) daily by mouth. 30 tablet 1  . metFORMIN (GLUCOPHAGE) 500 MG tablet Take 1 tablet (500 mg total) by mouth 2 (two) times daily with a meal. 60 tablet 1  . metoprolol tartrate (LOPRESSOR) 25 MG tablet Take 1 tablet (25 mg total) 2 (two) times daily by mouth. 60 tablet 1   No current facility-administered medications for this visit.     Allergies:   Patient has no known allergies.    Social History:  The patient  reports that he quit smoking about 10 years ago. His smoking use included cigarettes. He has a 3.50 pack-year smoking history. he has never used smokeless tobacco. He reports that he drinks alcohol. He reports that he does not use drugs.   Family History:  The patient's family history includes Diabetes in his maternal aunt and maternal  uncle; Heart disease in his maternal uncle; Hypertension in his father and mother.    ROS:  Please see the history of present illness.   Otherwise, review of systems are positive for none.   All other systems are reviewed and negative.    PHYSICAL EXAM: VS:  There were no vitals taken for this visit. , BMI There is no height or weight on file to calculate BMI. Affect appropriate Healthy:  appears stated age HEENT: normal Neck supple with no adenopathy JVP normal no bruits no thyromegaly Lungs clear with no wheezing and good diaphragmatic motion Heart:  S1/S2 no murmur, no rub, gallop or click PMI normal Abdomen: benighn, BS positve, no tenderness, no AAA no bruit.  No HSM or HJR Distal pulses intact with no bruits No edema Neuro non-focal Skin warm and dry No muscular weakness    EKG:  08/09/17 SR rate 89 normal    Recent Labs: 03/20/2017: Platelets 191 08/10/2017: ALT 25; BUN 16; Creatinine, Ser 0.94; Hemoglobin 16.1; Potassium 4.1; Sodium 133    Lipid Panel    Component Value Date/Time   CHOL 204 (H) 08/10/2017 0823   TRIG 119 08/10/2017 0823   HDL 34 (L) 08/10/2017 0823   CHOLHDL 6.0 08/10/2017 0823   VLDL 24 08/10/2017 0823   LDLCALC 146 (H) 08/10/2017 0823      Wt Readings from Last  3 Encounters:  08/09/17 260 lb 8 oz (118.2 kg)  08/07/17 261 lb (118.4 kg)  03/20/17 250 lb (113.4 kg)      Other studies Reviewed: Additional studies/ records that were reviewed today include: Notes primary 08/09/17 labs and ECG .    ASSESSMENT AND PLAN:  1.  Chest Pain 2. HTN 3. HLD 4. DM    Current medicines are reviewed at length with the patient today.  The patient does not have concerns regarding medicines.  The following changes have been made:  ***  Labs/ tests ordered today include: *** No orders of the defined types were placed in this encounter.    Disposition:   FU with cardiology PRN      Signed, Charlton HawsPeter Manu Rubey, MD  09/11/2017 11:24 AM    [facility] [address], [place], [place]  [address] Phone: [phone]; Fax: [phone]

## 2017-09-12 ENCOUNTER — Other Ambulatory Visit: Payer: Self-pay | Admitting: Physician Assistant

## 2017-09-12 MED ORDER — SITAGLIPTIN PHOSPHATE 100 MG PO TABS: 100.0000 mg | ORAL_TABLET | Freq: Every day | ORAL | 1 refills | Status: DC

## 2017-09-12 MED ORDER — METFORMIN HCL 500 MG PO TABS: 500.0000 mg | ORAL_TABLET | Freq: Two times a day (BID) | ORAL | 1 refills | Status: DC

## 2017-09-12 MED ORDER — ATORVASTATIN CALCIUM 20 MG PO TABS: 20.0000 mg | ORAL_TABLET | Freq: Every day | ORAL | 1 refills | Status: DC

## 2017-09-17 ENCOUNTER — Ambulatory Visit: Payer: Self-pay | Admitting: Cardiovascular Disease

## 2017-10-23 ENCOUNTER — Encounter: Payer: Self-pay | Admitting: Cardiovascular Disease

## 2017-10-23 ENCOUNTER — Ambulatory Visit (INDEPENDENT_AMBULATORY_CARE_PROVIDER_SITE_OTHER): Payer: Self-pay | Admitting: Cardiovascular Disease

## 2017-10-23 ENCOUNTER — Ambulatory Visit: Payer: Self-pay | Admitting: Physician Assistant

## 2017-10-23 ENCOUNTER — Encounter: Payer: Self-pay | Admitting: Physician Assistant

## 2017-10-23 VITALS — BP 150/100 | HR 94 | Ht 70.0 in | Wt 258.0 lb

## 2017-10-23 VITALS — BP 151/94 | HR 96 | Temp 98.2°F | Ht 70.0 in | Wt 259.0 lb

## 2017-10-23 DIAGNOSIS — Z91199 Patient's noncompliance with other medical treatment and regimen due to unspecified reason: Secondary | ICD-10-CM

## 2017-10-23 DIAGNOSIS — I10 Essential (primary) hypertension: Secondary | ICD-10-CM

## 2017-10-23 DIAGNOSIS — R079 Chest pain, unspecified: Secondary | ICD-10-CM

## 2017-10-23 DIAGNOSIS — E1165 Type 2 diabetes mellitus with hyperglycemia: Secondary | ICD-10-CM

## 2017-10-23 DIAGNOSIS — E669 Obesity, unspecified: Secondary | ICD-10-CM

## 2017-10-23 DIAGNOSIS — Z9119 Patient's noncompliance with other medical treatment and regimen: Secondary | ICD-10-CM

## 2017-10-23 DIAGNOSIS — E785 Hyperlipidemia, unspecified: Secondary | ICD-10-CM

## 2017-10-23 MED ORDER — LISINOPRIL 20 MG PO TABS: 20.0000 mg | ORAL_TABLET | Freq: Every day | ORAL | 3 refills | Status: DC

## 2017-10-23 NOTE — Patient Instructions (Signed)
Your physician recommends that you schedule a follow-up appointment in:  As needed with Dr.Koneswaran      INCREASE lisinopril to 20 mg daily    If you need a refill on your cardiac medications before your next appointment, please call your pharmacy.     No lab work or tests ordered today.       Thank you for choosing [facility] !

## 2017-10-23 NOTE — Progress Notes (Signed)
CARDIOLOGY CONSULT NOTE  Patient ID: Marcus Arellano MRN: [id number] DOB/AGE: 43-May-1977 43 y.o.  Admit date: (Not on file) Primary Physician: Jacquelin HawkingMcElroy, Shannon, PA-C Referring Physician: Jacquelin HawkingMcElroy, Shannon, PA-C  Reason for Consultation: Chest pain  HPI: Marcus Arellano is a 42 y.o. male who is being seen today for the evaluation of chest pain at the request of Jacquelin HawkingMcElroy, Shannon, PA-C.   He has a history of hypertension and diabetes.  I personally reviewed an ECG performed on 08/09/17 which demonstrated sinus rhythm with mild nonspecific ST segment and T wave abnormalities only in lead III.  There were no arrhythmias.  He said he was having some exertional chest pain but the last occurrence was 6 months ago.  He denies any recent chest pain.  He is lifting weights and jogging and has not had any exertional chest pain or dyspnea.  He also denies palpitations, leg swelling, orthopnea, and paroxysmal nocturnal dyspnea.  He said he has been on lisinopril 10 mg daily for at least a few months.  Blood pressure is elevated today, 150/100.  [place] moved here from [place] about [place] year ago.  He has a sister who lives in [place] as well.  [place] the [place].   No Known Allergies  Current Outpatient Medications  Medication Sig Dispense Refill  . atorvastatin (LIPITOR) 20 MG tablet Take 1 tablet (20 mg total) by mouth daily. 90 tablet 1  . BABY ASPIRIN PO Take by mouth.    Marland Kitchen. lisinopril (PRINIVIL,ZESTRIL) 10 MG tablet Take 1 tablet (10 mg total) by mouth daily. 30 tablet 1  . metFORMIN (GLUCOPHAGE) 500 MG tablet Take 1 tablet (500 mg total) by mouth 2 (two) times daily with a meal. 180 tablet 1  . sitaGLIPtin (JANUVIA) 100 MG tablet Take 1 tablet (100 mg total) by mouth daily. 90 tablet 1  . metoprolol tartrate (LOPRESSOR) 25 MG tablet Take 1 tablet (25 mg total) by mouth 2 (two) times daily. (Patient not taking: Reported on 10/23/2017)  60 tablet 1   No current facility-administered medications for this visit.     Past Medical History:  Diagnosis Date  . Diabetes mellitus without complication (HCC)    onset 2018  . Hypertension   . Seizures [facility](HCC) age 89   during childhood    History reviewed. No pertinent surgical history.  Social History   Socioeconomic History  . Marital status: Single    Spouse name: Not on file  . Number of children: Not on file  . Years of education: Not on file  . Highest education level: Not on file  Social Needs  . Financial resource strain: Not on file  . Food insecurity - worry: Not on file  . Food insecurity - inability: Not on file  . Transportation needs - medical: Not on file  . Transportation needs - non-medical: Not on file  Occupational History  . Not on file  Tobacco Use  . Smoking status: Former Smoker    Packs/day: 0.25    Years: 14.00    Pack years: 3.50    Types: Cigarettes    Last attempt to quit: 2008    Years since quitting: 11.0  . Smokeless tobacco: Never Used  Substance and Sexual Activity  . Alcohol use: Yes    Comment: ocassionally "special occassions"  . Drug use: No    Comment: last marijuana use 08/2016  . Sexual activity: Not  on file  Other Topics Concern  . Not on file  Social History Narrative  . Not on file     No family history of premature CAD in 1st degree relatives.  Current Meds  Medication Sig  . atorvastatin (LIPITOR) 20 MG tablet Take 1 tablet (20 mg total) by mouth daily.  Marland Kitchen BABY ASPIRIN PO Take by mouth.  Marland Kitchen lisinopril (PRINIVIL,ZESTRIL) 10 MG tablet Take 1 tablet (10 mg total) by mouth daily.  . metFORMIN (GLUCOPHAGE) 500 MG tablet Take 1 tablet (500 mg total) by mouth 2 (two) times daily with a meal.  . sitaGLIPtin (JANUVIA) 100 MG tablet Take 1 tablet (100 mg total) by mouth daily.      Review of systems complete and found to be negative unless listed above in HPI    Physical exam Blood pressure (!) 150/100, pulse  94, height 5\' 10"  (1.778 m), weight 258 lb (117 kg), SpO2 97 %. General: NAD Neck: No JVD, no thyromegaly or thyroid nodule.  Lungs: Clear to auscultation bilaterally with normal respiratory effort. CV: Nondisplaced PMI. Regular rate and rhythm, normal S1/S2, no S3/S4, no murmur.  No peripheral edema.  No carotid bruit.    Abdomen: Soft, nontender, no distention.  Skin: Intact without lesions or rashes.  Neurologic: Alert and oriented x 3.  Psych: Normal affect. Extremities: No clubbing or cyanosis.  HEENT: Normal.   ECG: Most recent ECG reviewed.   Labs: Lab Results  Component Value Date/Time   K 4.1 08/10/2017 08:23 AM   BUN 16 08/10/2017 08:23 AM   CREATININE 0.94 08/10/2017 08:23 AM   ALT 25 08/10/2017 08:23 AM   HGB 16.1 08/10/2017 08:23 AM     Lipids: Lab Results  Component Value Date/Time   LDLCALC 146 (H) 08/10/2017 08:23 AM   CHOL 204 (H) 08/10/2017 08:23 AM   TRIG 119 08/10/2017 08:23 AM   HDL 34 (L) 08/10/2017 08:23 AM        ASSESSMENT AND PLAN:  1.  Chest pain: No recurrences in the past 6 months.  He does need better blood pressure control and we talked about this to reduce the risk of cardiovascular events.  2.  Hypertension: Blood pressure is elevated.  I will increase lisinopril to 20 mg daily.     Disposition: Follow up as needed  Signed: Prentice Docker, M.D., F.A.C.C.  10/23/2017, 9:32 AM

## 2017-10-23 NOTE — Progress Notes (Signed)
BP (!) 151/94 (BP Location: Left Arm, Patient Position: Sitting, Cuff Size: Normal)   Pulse 96   Temp 98.2 F (36.8 C)   Ht 5\' 10"  (1.778 m)   Wt 259 lb (117.5 kg)   SpO2 97% Comment: 97  BMI 37.16 kg/m    Subjective:    Patient ID: Marcus Arellano, male    DOB: 10-15-1975, 42 y.o.   MRN: [id number]  HPI: Marcus MollyDeshawn Blassingame is a 42 y.o. male presenting on 10/23/2017 for Hypertension   HPI   Pt is again out of his medications.  He was counseled at last OV to avoid running out.  Pt was seen by cardiology today  Relevant past medical, surgical, family and social history reviewed and updated as indicated. Interim medical history since our last visit reviewed. Allergies and medications reviewed and updated.   Current Outpatient Medications:  .  BABY ASPIRIN PO, Take by mouth., Disp: , Rfl:  .  metFORMIN (GLUCOPHAGE) 500 MG tablet, Take 1 tablet (500 mg total) by mouth 2 (two) times daily with a meal., Disp: 180 tablet, Rfl: 1 .  sitaGLIPtin (JANUVIA) 100 MG tablet, Take 1 tablet (100 mg total) by mouth daily., Disp: 90 tablet, Rfl: 1 .  atorvastatin (LIPITOR) 20 MG tablet, Take 1 tablet (20 mg total) by mouth daily. (Patient not taking: Reported on 10/23/2017), Disp: 90 tablet, Rfl: 1 .  lisinopril (PRINIVIL,ZESTRIL) 20 MG tablet, Take 1 tablet (20 mg total) by mouth daily. (Patient not taking: Reported on 10/23/2017), Disp: 90 tablet, Rfl: 3 .  metoprolol tartrate (LOPRESSOR) 25 MG tablet, Take 1 tablet (25 mg total) by mouth 2 (two) times daily. (Patient not taking: Reported on 10/23/2017), Disp: 60 tablet, Rfl: 1  Review of Systems  Constitutional: Negative for appetite change, chills, diaphoresis, fatigue, fever and unexpected weight change.  HENT: Negative for congestion, dental problem, drooling, ear pain, facial swelling, hearing loss, mouth sores, sneezing, sore throat, trouble swallowing and voice change.   Eyes: Negative for pain, discharge, redness, itching and visual disturbance.   Respiratory: Negative for cough, choking, shortness of breath and wheezing.   Cardiovascular: Negative for chest pain, palpitations and leg swelling.  Gastrointestinal: Negative for abdominal pain, blood in stool, constipation, diarrhea and vomiting.  Endocrine: Negative for cold intolerance, heat intolerance and polydipsia.  Genitourinary: Negative for decreased urine volume, dysuria and hematuria.  Musculoskeletal: Negative for arthralgias, back pain and gait problem.  Skin: Negative for rash.  Allergic/Immunologic: Negative for environmental allergies.  Neurological: Negative for seizures, syncope, light-headedness and headaches.  Hematological: Negative for adenopathy.  Psychiatric/Behavioral: Negative for agitation, dysphoric mood and suicidal ideas. The patient is not nervous/anxious.     Per HPI unless specifically indicated above     Objective:    BP (!) 151/94 (BP Location: Left Arm, Patient Position: Sitting, Cuff Size: Normal)   Pulse 96   Temp 98.2 F (36.8 C)   Ht 5\' 10"  (1.778 m)   Wt 259 lb (117.5 kg)   SpO2 97% Comment: 97  BMI 37.16 kg/m   Wt Readings from Last 3 Encounters:  10/23/17 259 lb (117.5 kg)  10/23/17 258 lb (117 kg)  09/11/17 260 lb 8 oz (118.2 kg)    Physical Exam  Constitutional: He is oriented to person, place, and time. He appears well-developed and well-nourished.  HENT:  Head: Normocephalic and atraumatic.  Neck: Neck supple.  Cardiovascular: Normal rate and regular rhythm.  Pulmonary/Chest: Effort normal and breath sounds normal. He has no wheezes.  Abdominal: Soft. Bowel sounds are normal. There is no hepatosplenomegaly. There is no tenderness.  Musculoskeletal: He exhibits no edema.  Lymphadenopathy:    He has no cervical adenopathy.  Neurological: He is alert and oriented to person, place, and time.  Skin: Skin is warm and dry.  Psychiatric: He has a normal mood and affect. His behavior is normal.  Vitals reviewed.        Assessment & Plan:   Encounter Diagnoses  Name Primary?  Marland Kitchen Uncontrolled type 2 diabetes mellitus with hyperglycemia (HCC) Yes  . Essential hypertension   . Hyperlipidemia, unspecified hyperlipidemia type   . Personal history of noncompliance with medical treatment, presenting hazards to health   . Obesity, unspecified classification, unspecified obesity type, unspecified whether serious comorbidity present      -pt counseled again to avoid running out of his medications -pt to follow up  4 wk. Labs before OV.  RTO sooner prn

## 2017-11-20 ENCOUNTER — Ambulatory Visit: Payer: Self-pay | Admitting: Physician Assistant

## 2017-11-22 ENCOUNTER — Encounter: Payer: Self-pay | Admitting: Physician Assistant

## 2017-11-22 ENCOUNTER — Ambulatory Visit: Payer: Self-pay | Admitting: Physician Assistant

## 2017-11-22 ENCOUNTER — Other Ambulatory Visit: Payer: Self-pay | Admitting: Physician Assistant

## 2017-11-22 VITALS — BP 140/82 | HR 87 | Temp 97.0°F | Ht 70.0 in | Wt 256.2 lb

## 2017-11-22 DIAGNOSIS — E1165 Type 2 diabetes mellitus with hyperglycemia: Secondary | ICD-10-CM

## 2017-11-22 DIAGNOSIS — Z9119 Patient's noncompliance with other medical treatment and regimen: Secondary | ICD-10-CM

## 2017-11-22 DIAGNOSIS — Z91199 Patient's noncompliance with other medical treatment and regimen due to unspecified reason: Secondary | ICD-10-CM

## 2017-11-22 DIAGNOSIS — I10 Essential (primary) hypertension: Secondary | ICD-10-CM

## 2017-11-22 DIAGNOSIS — E669 Obesity, unspecified: Secondary | ICD-10-CM

## 2017-11-22 DIAGNOSIS — E785 Hyperlipidemia, unspecified: Secondary | ICD-10-CM

## 2017-11-22 MED ORDER — ATORVASTATIN CALCIUM 20 MG PO TABS: 20.0000 mg | ORAL_TABLET | Freq: Every day | ORAL | 0 refills | Status: DC

## 2017-11-22 MED ORDER — METOPROLOL TARTRATE 50 MG PO TABS: 50.0000 mg | ORAL_TABLET | Freq: Two times a day (BID) | ORAL | 1 refills | Status: DC

## 2017-11-22 MED ORDER — ATORVASTATIN CALCIUM 20 MG PO TABS: 20.0000 mg | ORAL_TABLET | Freq: Every day | ORAL | 1 refills | Status: DC

## 2017-11-22 MED ORDER — METOPROLOL TARTRATE 25 MG PO TABS: 25.0000 mg | ORAL_TABLET | Freq: Two times a day (BID) | ORAL | 0 refills | Status: DC

## 2017-11-22 MED ORDER — METOPROLOL TARTRATE 25 MG PO TABS: 25.0000 mg | ORAL_TABLET | Freq: Two times a day (BID) | ORAL | 1 refills | Status: DC

## 2017-11-22 MED ORDER — LISINOPRIL 20 MG PO TABS: 20.0000 mg | ORAL_TABLET | Freq: Every day | ORAL | 3 refills | Status: DC

## 2017-11-22 MED ORDER — LISINOPRIL 20 MG PO TABS: 20.0000 mg | ORAL_TABLET | Freq: Every day | ORAL | 0 refills | Status: DC

## 2017-11-22 NOTE — Progress Notes (Signed)
BP 140/82 (BP Location: Left Arm, Patient Position: Sitting, Cuff Size: Large)   Pulse 87   Temp (!) 97 F (36.1 C)   Ht 5\' 10"  (1.778 m)   Wt 256 lb 4 oz (116.2 kg)   SpO2 97%   BMI 36.77 kg/m    Subjective:    Patient ID: Marcus Arellano, male    DOB: 07/16/1976, 42 y.o.   MRN: [id number]  HPI: Marcus MollyDeshawn Whilden is a 42 y.o. male presenting on 11/22/2017 for Hypertension; Hyperlipidemia; and Diabetes   HPI Pt is only taking lisinopril 10mg .  He never got his new rx for 20mg  lisinopril  Pt did not get labs drawn as instructed  Pt is feeling well today and has no complaints  Relevant past medical, surgical, family and social history reviewed and updated as indicated. Interim medical history since our last visit reviewed. Allergies and medications reviewed and updated.   Current Outpatient Medications:  .  atorvastatin (LIPITOR) 20 MG tablet, Take 1 tablet (20 mg total) by mouth daily., Disp: 90 tablet, Rfl: 1 .  BABY ASPIRIN PO, Take by mouth., Disp: , Rfl:  .  lisinopril (PRINIVIL,ZESTRIL) 20 MG tablet, Take 1 tablet (20 mg total) by mouth daily., Disp: 90 tablet, Rfl: 3 .  metFORMIN (GLUCOPHAGE) 500 MG tablet, Take 1 tablet (500 mg total) by mouth 2 (two) times daily with a meal., Disp: 180 tablet, Rfl: 1 .  metoprolol tartrate (LOPRESSOR) 25 MG tablet, Take 25 mg by mouth 2 (two) times daily., Disp: , Rfl:  .  sitaGLIPtin (JANUVIA) 100 MG tablet, Take 1 tablet (100 mg total) by mouth daily., Disp: 90 tablet, Rfl: 1   Review of Systems  Constitutional: Negative for appetite change, chills, diaphoresis, fatigue, fever and unexpected weight change.  HENT: Negative for congestion, dental problem, drooling, ear pain, facial swelling, hearing loss, mouth sores, sneezing, sore throat, trouble swallowing and voice change.   Eyes: Negative for pain, discharge, redness, itching and visual disturbance.  Respiratory: Negative for cough, choking, shortness of breath and wheezing.    Cardiovascular: Negative for chest pain, palpitations and leg swelling.  Gastrointestinal: Negative for abdominal pain, blood in stool, constipation, diarrhea and vomiting.  Endocrine: Negative for cold intolerance, heat intolerance and polydipsia.  Genitourinary: Negative for decreased urine volume, dysuria and hematuria.  Musculoskeletal: Negative for arthralgias, back pain and gait problem.  Skin: Negative for rash.  Allergic/Immunologic: Negative for environmental allergies.  Neurological: Negative for seizures, syncope, light-headedness and headaches.  Hematological: Negative for adenopathy.  Psychiatric/Behavioral: Negative for agitation, dysphoric mood and suicidal ideas. The patient is not nervous/anxious.     Per HPI unless specifically indicated above     Objective:    BP 140/82 (BP Location: Left Arm, Patient Position: Sitting, Cuff Size: Large)   Pulse 87   Temp (!) 97 F (36.1 C)   Ht 5\' 10"  (1.778 m)   Wt 256 lb 4 oz (116.2 kg)   SpO2 97%   BMI 36.77 kg/m   Wt Readings from Last 3 Encounters:  11/22/17 256 lb 4 oz (116.2 kg)  10/23/17 259 lb (117.5 kg)  10/23/17 258 lb (117 kg)    Physical Exam  Constitutional: He is oriented to person, place, and time. He appears well-developed and well-nourished.  HENT:  Head: Normocephalic and atraumatic.  Neck: Neck supple.  Cardiovascular: Normal rate and regular rhythm.  Pulmonary/Chest: Effort normal and breath sounds normal. He has no wheezes.  Abdominal: Soft. Bowel sounds are normal. There is  no hepatosplenomegaly. There is no tenderness.  Musculoskeletal: He exhibits no edema.  Lymphadenopathy:    He has no cervical adenopathy.  Neurological: He is alert and oriented to person, place, and time.  Skin: Skin is warm and dry.  Psychiatric: He has a normal mood and affect. His behavior is normal.  Vitals reviewed.       Assessment & Plan:   Encounter Diagnoses  Name Primary?  . Essential hypertension Yes   . Uncontrolled type 2 diabetes mellitus with hyperglycemia (HCC)   . Hyperlipidemia, unspecified hyperlipidemia type   . Personal history of noncompliance with medical treatment, presenting hazards to health   . Obesity, unspecified classification, unspecified obesity type, unspecified whether serious comorbidity present     -pt counseled to Get on the 20mg  lisinopril.  Encouraged him to get in habit of checking his meds against the AV[facility] med list periodically -pt to Get labs drawn -will refer for diabetic eye exam -Pt to follow up 1 month.  RTO sooner prn

## 2017-11-30 ENCOUNTER — Other Ambulatory Visit (HOSPITAL_COMMUNITY)
Admission: RE | Admit: 2017-11-30 | Discharge: 2017-11-30 | Disposition: A | Discharge status: Home / Self Care | Payer: Self-pay | Source: Ambulatory Visit | Attending: Physician Assistant | Admitting: Physician Assistant

## 2017-11-30 DIAGNOSIS — I10 Essential (primary) hypertension: Secondary | ICD-10-CM

## 2017-11-30 DIAGNOSIS — E1165 Type 2 diabetes mellitus with hyperglycemia: Secondary | ICD-10-CM

## 2017-11-30 DIAGNOSIS — E785 Hyperlipidemia, unspecified: Secondary | ICD-10-CM

## 2017-11-30 LAB — COMPREHENSIVE METABOLIC PANEL
ALK PHOS: 71 U/L (ref 38–126)
ALT: 33 U/L (ref 17–63)
AST: 25 U/L (ref 15–41)
Albumin: 4.1 g/dL (ref 3.5–5.0)
Anion gap: 10 (ref 5–15)
BILIRUBIN TOTAL: 0.6 mg/dL (ref 0.3–1.2)
BUN: 16 mg/dL (ref 6–20)
CHLORIDE: 100 mmol/L — AB (ref 101–111)
CO2: 26 mmol/L (ref 22–32)
CREATININE: 0.97 mg/dL (ref 0.61–1.24)
Calcium: 9.3 mg/dL (ref 8.9–10.3)
Glucose, Bld: 127 mg/dL — ABNORMAL HIGH (ref 65–99)
Potassium: 4 mmol/L (ref 3.5–5.1)
Sodium: 136 mmol/L (ref 135–145)
TOTAL PROTEIN: 7.4 g/dL (ref 6.5–8.1)

## 2017-11-30 LAB — HEMOGLOBIN A1C
HEMOGLOBIN A1C: 7.5 % — AB (ref 4.8–5.6)
MEAN PLASMA GLUCOSE: 168.55 mg/dL

## 2017-11-30 LAB — LIPID PANEL
Cholesterol: 111 mg/dL (ref 0–200)
HDL: 34 mg/dL — AB (ref >40)
LDL CALC: 65 mg/dL (ref 0–99)
TRIGLYCERIDES: 59 mg/dL (ref <150)
Total CHOL/HDL Ratio: 3.3 RATIO
VLDL: 12 mg/dL (ref 0–40)

## 2017-12-20 ENCOUNTER — Other Ambulatory Visit: Payer: Self-pay | Admitting: Physician Assistant

## 2017-12-20 ENCOUNTER — Encounter: Payer: Self-pay | Admitting: Physician Assistant

## 2017-12-20 ENCOUNTER — Ambulatory Visit: Payer: Self-pay | Admitting: Physician Assistant

## 2017-12-20 VITALS — BP 145/91 | HR 85 | Temp 97.5°F | Ht 70.0 in | Wt 255.0 lb

## 2017-12-20 DIAGNOSIS — I10 Essential (primary) hypertension: Secondary | ICD-10-CM

## 2017-12-20 DIAGNOSIS — E785 Hyperlipidemia, unspecified: Secondary | ICD-10-CM

## 2017-12-20 DIAGNOSIS — E1165 Type 2 diabetes mellitus with hyperglycemia: Secondary | ICD-10-CM

## 2017-12-20 MED ORDER — METFORMIN HCL 1000 MG PO TABS: 1000.0000 mg | ORAL_TABLET | Freq: Two times a day (BID) | ORAL | 1 refills | Status: AC

## 2017-12-20 MED ORDER — ATORVASTATIN CALCIUM 20 MG PO TABS: 20.0000 mg | ORAL_TABLET | Freq: Every day | ORAL | 1 refills | Status: AC

## 2017-12-20 MED ORDER — LISINOPRIL 20 MG PO TABS: 20.0000 mg | ORAL_TABLET | Freq: Every day | ORAL | 1 refills | Status: DC

## 2017-12-20 NOTE — Progress Notes (Signed)
BP (!) 145/91 (BP Location: Left Arm, Patient Position: Sitting, Cuff Size: Large)   Pulse 85   Temp (!) 97.5 F (36.4 C)   Ht 5\' 10"  (1.778 m)   Wt 255 lb (115.7 kg)   SpO2 98%   BMI 36.59 kg/m    Subjective:    Patient ID: Marcus Arellano, male    DOB: 04/28/1976, 42 y.o.   MRN: [id number]  HPI: Marcus Arellano is a 42 y.o. male presenting on 12/20/2017 for Hypertension; Diabetes; and Hyperlipidemia   HPI Pt still did not get his 20mg  lisinopril  He is feeling well  Relevant past medical, surgical, family and social history reviewed and updated as indicated. Interim medical history since our last visit reviewed. Allergies and medications reviewed and updated.   Current Outpatient Medications:  .  atorvastatin (LIPITOR) 20 MG tablet, Take 1 tablet (20 mg total) by mouth daily., Disp: 90 tablet, Rfl: 0 .  BABY ASPIRIN PO, Take 81 mg by mouth daily. , Disp: , Rfl:  .  lisinopril (PRINIVIL,ZESTRIL) 10 MG tablet, Take 10 mg by mouth daily., Disp: , Rfl:  .  metFORMIN (GLUCOPHAGE) 500 MG tablet, Take 1 tablet (500 mg total) by mouth 2 (two) times daily with a meal., Disp: 180 tablet, Rfl: 1 .  metoprolol tartrate (LOPRESSOR) 25 MG tablet, Take 1 tablet (25 mg total) by mouth 2 (two) times daily., Disp: 180 tablet, Rfl: 0 .  sitaGLIPtin (JANUVIA) 100 MG tablet, Take 1 tablet (100 mg total) by mouth daily., Disp: 90 tablet, Rfl: 1 .  lisinopril (PRINIVIL,ZESTRIL) 20 MG tablet, Take 1 tablet (20 mg total) by mouth daily. (Patient not taking: Reported on 12/20/2017), Disp: 90 tablet, Rfl: 0   Review of Systems  Constitutional: Negative for appetite change, chills, diaphoresis, fatigue, fever and unexpected weight change.  HENT: Negative for congestion, dental problem, drooling, ear pain, facial swelling, hearing loss, mouth sores, sneezing, sore throat, trouble swallowing and voice change.   Eyes: Negative for pain, discharge, redness, itching and visual disturbance.  Respiratory:  Negative for cough, choking, shortness of breath and wheezing.   Cardiovascular: Negative for chest pain, palpitations and leg swelling.  Gastrointestinal: Negative for abdominal pain, blood in stool, constipation, diarrhea and vomiting.  Endocrine: Negative for cold intolerance, heat intolerance and polydipsia.  Genitourinary: Negative for decreased urine volume, dysuria and hematuria.  Musculoskeletal: Negative for arthralgias, back pain and gait problem.  Skin: Negative for rash.  Allergic/Immunologic: Negative for environmental allergies.  Neurological: Negative for seizures, syncope, light-headedness and headaches.  Hematological: Negative for adenopathy.  Psychiatric/Behavioral: Negative for agitation, dysphoric mood and suicidal ideas. The patient is not nervous/anxious.     Per HPI unless specifically indicated above     Objective:    BP (!) 145/91 (BP Location: Left Arm, Patient Position: Sitting, Cuff Size: Large)   Pulse 85   Temp (!) 97.5 F (36.4 C)   Ht 5\' 10"  (1.778 m)   Wt 255 lb (115.7 kg)   SpO2 98%   BMI 36.59 kg/m   Wt Readings from Last 3 Encounters:  12/20/17 255 lb (115.7 kg)  11/22/17 256 lb 4 oz (116.2 kg)  10/23/17 259 lb (117.5 kg)    Physical Exam  Constitutional: He is oriented to person, place, and time. He appears well-developed and well-nourished.  HENT:  Head: Normocephalic and atraumatic.  Neck: Neck supple.  Cardiovascular: Normal rate and regular rhythm.  Pulmonary/Chest: Effort normal and breath sounds normal. He has no wheezes.  Abdominal:  Soft. Bowel sounds are normal. There is no hepatosplenomegaly. There is no tenderness.  Musculoskeletal: He exhibits no edema.  Lymphadenopathy:    He has no cervical adenopathy.  Neurological: He is alert and oriented to person, place, and time.  Skin: Skin is warm and dry.  Psychiatric: He has a normal mood and affect. His behavior is normal.  Vitals reviewed.   Results for orders placed or  performed during the hospital encounter of 11/30/17  Hemoglobin A1c  Result Value Ref Range   Hgb A1c MFr Bld 7.5 (H) 4.8 - 5.6 %   Mean Plasma Glucose 168.55 mg/dL  Lipid panel  Result Value Ref Range   Cholesterol 111 0 - 200 mg/dL   Triglycerides 59 <782<150 mg/dL   HDL 34 (L) >95>40 mg/dL   Total CHOL/HDL Ratio 3.3 RATIO   VLDL 12 0 - 40 mg/dL   LDL Cholesterol 65 0 - 99 mg/dL  Comprehensive metabolic panel  Result Value Ref Range   Sodium 136 135 - 145 mmol/L   Potassium 4.0 3.5 - 5.1 mmol/L   Chloride 100 (L) 101 - 111 mmol/L   CO2 26 22 - 32 mmol/L   Glucose, Bld 127 (H) 65 - 99 mg/dL   BUN 16 6 - 20 mg/dL   Creatinine, Ser 6.210.97 0.61 - 1.24 mg/dL   Calcium 9.3 8.9 - 30.810.3 mg/dL   Total Protein 7.4 6.5 - 8.1 g/dL   Albumin 4.1 3.5 - 5.0 g/dL   AST 25 15 - 41 U/L   ALT 33 17 - 63 U/L   Alkaline Phosphatase 71 38 - 126 U/L   Total Bilirubin 0.6 0.3 - 1.2 mg/dL   GFR calc non Af Amer >60 >60 mL/min   GFR calc Af Amer >60 >60 mL/min   Anion gap 10 5 - 15      Assessment & Plan:   Encounter Diagnoses  Name Primary?  . Essential hypertension Yes  . Uncontrolled type 2 diabetes mellitus with hyperglycemia (HCC)   . Hyperlipidemia, unspecified hyperlipidemia type     -reviewed labs with pt -Pt to get lisinopril 20 as previously instructed -will Increase metformin to 1000mg  daily. Continue [place]. Counseled to watch diabetic diet -Pt on list for DM eye exam -pt to follow up 1 month to recheck blood pressure

## 2018-01-28 ENCOUNTER — Ambulatory Visit: Payer: Self-pay | Admitting: Physician Assistant

## 2018-01-28 ENCOUNTER — Encounter: Payer: Self-pay | Admitting: Physician Assistant

## 2018-01-28 VITALS — BP 144/95 | HR 78 | Temp 97.3°F | Ht 70.0 in | Wt 251.5 lb

## 2018-01-28 DIAGNOSIS — E1165 Type 2 diabetes mellitus with hyperglycemia: Secondary | ICD-10-CM

## 2018-01-28 DIAGNOSIS — E669 Obesity, unspecified: Secondary | ICD-10-CM

## 2018-01-28 DIAGNOSIS — E785 Hyperlipidemia, unspecified: Secondary | ICD-10-CM

## 2018-01-28 DIAGNOSIS — I10 Essential (primary) hypertension: Secondary | ICD-10-CM

## 2018-01-28 MED ORDER — METOPROLOL TARTRATE 50 MG PO TABS: 50.0000 mg | ORAL_TABLET | Freq: Two times a day (BID) | ORAL | 3 refills | Status: AC

## 2018-01-28 NOTE — Progress Notes (Signed)
BP (!) 144/95   Pulse 78   Temp (!) 97.3 F (36.3 C)   Ht  (1.778 m)   Wt 251 lb 8 oz (114.1 kg)   SpO2 100%   BMI 36.09 kg/m    Subjective:    Patient ID: Marcus Arellano, male    DOB: 22-Oct-1975, 42 y.o.   MRN: [id number]  HPI: Marcus Arellano is a 42 y.o. male presenting on 01/28/2018 for Hypertension   HPI   Pt says he feels fine.  He says he is taking all of his medications as prescribed.   Relevant past medical, surgical, family and social history reviewed and updated as indicated. Interim medical history since our last visit reviewed. Allergies and medications reviewed and updated.   Current Outpatient Medications:  .  atorvastatin (LIPITOR) 20 MG tablet, Take 1 tablet (20 mg total) by mouth daily., Disp: 90 tablet, Rfl: 1 .  BABY ASPIRIN PO, Take 81 mg by mouth daily. , Disp: , Rfl:  .  lisinopril (PRINIVIL,ZESTRIL) 20 MG tablet, Take 1 tablet (20 mg total) by mouth daily., Disp: 30 tablet, Rfl: 1 .  metFORMIN (GLUCOPHAGE) 1000 MG tablet, Take 1 tablet (1,000 mg total) by mouth 2 (two) times daily with a meal., Disp: 180 tablet, Rfl: 1 .  metoprolol tartrate (LOPRESSOR) 25 MG tablet, Take 1 tablet (25 mg total) by mouth 2 (two) times daily., Disp: 180 tablet, Rfl: 0 .  sitaGLIPtin (JANUVIA) 100 MG tablet, Take 1 tablet (100 mg total) by mouth daily., Disp: 90 tablet, Rfl: 1  Review of Systems  Constitutional: Negative for appetite change, chills, diaphoresis, fatigue, fever and unexpected weight change.  HENT: Negative for congestion, dental problem, drooling, ear pain, facial swelling, hearing loss, mouth sores, sneezing, sore throat, trouble swallowing and voice change.   Eyes: Negative for pain, discharge, redness, itching and visual disturbance.  Respiratory: Negative for cough, choking, shortness of breath and wheezing.   Cardiovascular: Negative for chest pain, palpitations and leg swelling.  Gastrointestinal: Negative for abdominal pain, blood in stool,  constipation, diarrhea and vomiting.  Endocrine: Negative for cold intolerance, heat intolerance and polydipsia.  Genitourinary: Negative for decreased urine volume, dysuria and hematuria.  Musculoskeletal: Negative for arthralgias, back pain and gait problem.  Skin: Negative for rash.  Allergic/Immunologic: Negative for environmental allergies.  Neurological: Negative for seizures, syncope, light-headedness and headaches.  Hematological: Negative for adenopathy.  Psychiatric/Behavioral: Negative for agitation, dysphoric mood and suicidal ideas. The patient is not nervous/anxious.     Per HPI unless specifically indicated above     Objective:    BP (!) 144/95   Pulse 78   Temp (!) 97.3 F (36.3 C)   Ht  (1.778 m)   Wt 251 lb 8 oz (114.1 kg)   SpO2 100%   BMI 36.09 kg/m   Wt Readings from Last 3 Encounters:  01/28/18 251 lb 8 oz (114.1 kg)  12/20/17 255 lb (115.7 kg)  11/22/17 256 lb 4 oz (116.2 kg)    Physical Exam  Constitutional: He is oriented to person, place, and time. He appears well-developed and well-nourished.  HENT:  Head: Normocephalic and atraumatic.  Neck: Neck supple.  Cardiovascular: Normal rate and regular rhythm.  Pulmonary/Chest: Effort normal and breath sounds normal. He has no wheezes.  Abdominal: Soft. Bowel sounds are normal. There is no hepatosplenomegaly. There is no tenderness.  Musculoskeletal: He exhibits no edema.  Lymphadenopathy:    He has no cervical adenopathy.  Neurological: He is alert  and oriented to person, place, and time.  Skin: Skin is warm and dry.  Psychiatric: He has a normal mood and affect. His behavior is normal.  Vitals reviewed.       Assessment & Plan:   Encounter Diagnoses  Name Primary?  . Essential hypertension Yes  . Uncontrolled type 2 diabetes mellitus with hyperglycemia (HCC)   . Hyperlipidemia, unspecified hyperlipidemia type   . Obesity, unspecified classification, unspecified obesity type,  unspecified whether serious comorbidity present     -Increase metoprolol to  bid.  Pt to continue other medications -Follow up 1 month to recheck the blood pressure.  RTO sooner prn

## 2018-02-21 ENCOUNTER — Other Ambulatory Visit (HOSPITAL_COMMUNITY)
Admission: RE | Admit: 2018-02-21 | Discharge: 2018-02-21 | Disposition: A | Discharge status: Home / Self Care | Payer: Self-pay | Source: Ambulatory Visit | Attending: Physician Assistant | Admitting: Physician Assistant

## 2018-02-21 DIAGNOSIS — I10 Essential (primary) hypertension: Secondary | ICD-10-CM | POA: Insufficient documentation

## 2018-02-21 DIAGNOSIS — E1165 Type 2 diabetes mellitus with hyperglycemia: Secondary | ICD-10-CM | POA: Insufficient documentation

## 2018-02-21 DIAGNOSIS — E785 Hyperlipidemia, unspecified: Secondary | ICD-10-CM | POA: Insufficient documentation

## 2018-02-21 LAB — LIPID PANEL
Cholesterol: 143 mg/dL (ref 0–200)
HDL: 39 mg/dL — ABNORMAL LOW (ref >40)
LDL CALC: 85 mg/dL (ref 0–99)
TRIGLYCERIDES: 95 mg/dL (ref <150)
Total CHOL/HDL Ratio: 3.7 RATIO
VLDL: 19 mg/dL (ref 0–40)

## 2018-02-21 LAB — COMPREHENSIVE METABOLIC PANEL
ALBUMIN: 4.1 g/dL (ref 3.5–5.0)
ALT: 36 U/L (ref 17–63)
AST: 30 U/L (ref 15–41)
Alkaline Phosphatase: 73 U/L (ref 38–126)
Anion gap: 8 (ref 5–15)
BUN: 12 mg/dL (ref 6–20)
CALCIUM: 9.4 mg/dL (ref 8.9–10.3)
CO2: 28 mmol/L (ref 22–32)
CREATININE: 0.99 mg/dL (ref 0.61–1.24)
Chloride: 103 mmol/L (ref 101–111)
GFR calc Af Amer: >60 mL/min (ref >60)
GLUCOSE: 127 mg/dL — AB (ref 65–99)
Potassium: 4.3 mmol/L (ref 3.5–5.1)
Sodium: 139 mmol/L (ref 135–145)
Total Bilirubin: 0.9 mg/dL (ref 0.3–1.2)
Total Protein: 7.4 g/dL (ref 6.5–8.1)

## 2018-02-21 LAB — HEMOGLOBIN A1C
Hgb A1c MFr Bld: 6.2 % — ABNORMAL HIGH (ref 4.8–5.6)
Mean Plasma Glucose: 131.24 mg/dL

## 2018-02-27 ENCOUNTER — Encounter: Payer: Self-pay | Admitting: Physician Assistant

## 2018-02-27 ENCOUNTER — Ambulatory Visit: Payer: Self-pay | Admitting: Physician Assistant

## 2018-02-27 VITALS — BP 140/94 | HR 71 | Temp 97.5°F | Ht 70.0 in | Wt 253.0 lb

## 2018-02-27 DIAGNOSIS — E785 Hyperlipidemia, unspecified: Secondary | ICD-10-CM

## 2018-02-27 DIAGNOSIS — E119 Type 2 diabetes mellitus without complications: Secondary | ICD-10-CM

## 2018-02-27 DIAGNOSIS — I10 Essential (primary) hypertension: Secondary | ICD-10-CM

## 2018-02-27 NOTE — Progress Notes (Signed)
BP (!) 140/94 (BP Location: Left Arm, Patient Position: Sitting, Cuff Size: Large)   Pulse 71   Temp (!) 97.5 F (36.4 C)   Ht  (1.778 m)   Wt 253 lb (114.8 kg)   SpO2 98%   BMI 36.30 kg/m    Subjective:    Patient ID: Marcus Arellano, male    DOB: [birth year], 42 y.o.   MRN: [id number]  HPI: Marcus Arellano is a 42 y.o. male presenting on 02/27/2018 for Hypertension   HPI Pt missed some days of his bp meds because he went out of town without his meds.  He is feeling well  Relevant past medical, surgical, family and social history reviewed and updated as indicated. Interim medical history since our last visit reviewed. Allergies and medications reviewed and updated.   Current Outpatient Medications:  .  atorvastatin (LIPITOR) 20 MG tablet, Take 1 tablet (20 mg total) by mouth daily., Disp: 90 tablet, Rfl: 1 .  BABY ASPIRIN PO, Take 81 mg by mouth daily. , Disp: , Rfl:  .  lisinopril (PRINIVIL,ZESTRIL) 20 MG tablet, Take 1 tablet (20 mg total) by mouth daily., Disp: 30 tablet, Rfl: 1 .  metFORMIN (GLUCOPHAGE) 1000 MG tablet, Take 1 tablet (1,000 mg total) by mouth 2 (two) times daily with a meal., Disp: 180 tablet, Rfl: 1 .  metoprolol tartrate (LOPRESSOR) 50 MG tablet, Take 1 tablet (50 mg total) by mouth 2 (two) times daily., Disp: 60 tablet, Rfl: 3 .  sitaGLIPtin (JANUVIA) 100 MG tablet, Take 1 tablet (100 mg total) by mouth daily., Disp: 90 tablet, Rfl: 1   Review of Systems  Constitutional: Negative for appetite change, chills, diaphoresis, fatigue, fever and unexpected weight change.  HENT: Negative for congestion, dental problem, drooling, ear pain, facial swelling, hearing loss, mouth sores, sneezing, sore throat, trouble swallowing and voice change.   Eyes: Negative for pain, discharge, redness, itching and visual disturbance.  Respiratory: Negative for cough, choking, shortness of breath and wheezing.   Cardiovascular: Negative for chest pain, palpitations and leg  swelling.  Gastrointestinal: Negative for abdominal pain, blood in stool, constipation, diarrhea and vomiting.  Endocrine: Negative for cold intolerance, heat intolerance and polydipsia.  Genitourinary: Negative for decreased urine volume, dysuria and hematuria.  Musculoskeletal: Negative for arthralgias, back pain and gait problem.  Skin: Negative for rash.  Allergic/Immunologic: Negative for environmental allergies.  Neurological: Negative for seizures, syncope, light-headedness and headaches.  Hematological: Negative for adenopathy.  Psychiatric/Behavioral: Negative for agitation, dysphoric mood and suicidal ideas. The patient is not nervous/anxious.     Per HPI unless specifically indicated above     Objective:    BP (!) 140/94 (BP Location: Left Arm, Patient Position: Sitting, Cuff Size: Large)   Pulse 71   Temp (!) 97.5 F (36.4 C)   Ht  (1.778 m)   Wt 253 lb (114.8 kg)   SpO2 98%   BMI 36.30 kg/m   Wt Readings from Last 3 Encounters:  02/27/18 253 lb (114.8 kg)  01/28/18 251 lb 8 oz (114.1 kg)  12/20/17 255 lb (115.7 kg)    Physical Exam  Constitutional: He is oriented to person, place, and time. He appears well-developed and well-nourished.  HENT:  Head: Normocephalic and atraumatic.  Neck: Neck supple.  Cardiovascular: Normal rate and regular rhythm.  Pulmonary/Chest: Effort normal and breath sounds normal. He has no wheezes.  Abdominal: Soft. Bowel sounds are normal. There is no hepatosplenomegaly. There is no tenderness.  Musculoskeletal: He exhibits  no edema.  Lymphadenopathy:    He has no cervical adenopathy.  Neurological: He is alert and oriented to person, place, and time.  Skin: Skin is warm and dry.  Psychiatric: He has a normal mood and affect. His behavior is normal.  Vitals reviewed.   Results for orders placed or performed during the hospital encounter of 02/21/18  Hemoglobin A1c  Result Value Ref Range   Hgb A1c MFr Bld 6.2 (H) 4.8 - 5.6  %   Mean Plasma Glucose 131.24 mg/dL  Lipid panel  Result Value Ref Range   Cholesterol 143 0 - 200 mg/dL   Triglycerides 95 <161 mg/dL   HDL 39 (L) >09 mg/dL   Total CHOL/HDL Ratio 3.7 RATIO   VLDL 19 0 - 40 mg/dL   LDL Cholesterol 85 0 - 99 mg/dL  Comprehensive metabolic panel  Result Value Ref Range   Sodium 139 135 - 145 mmol/L   Potassium 4.3 3.5 - 5.1 mmol/L   Chloride 103 101 - 111 mmol/L   CO2 28 22 - 32 mmol/L   Glucose, Bld 127 (H) 65 - 99 mg/dL   BUN 12 6 - 20 mg/dL   Creatinine, Ser 6.04 0.61 - 1.24 mg/dL   Calcium 9.4 8.9 - 54.0 mg/dL   Total Protein 7.4 6.5 - 8.1 g/dL   Albumin 4.1 3.5 - 5.0 g/dL   AST 30 15 - 41 U/L   ALT 36 17 - 63 U/L   Alkaline Phosphatase 73 38 - 126 U/L   Total Bilirubin 0.9 0.3 - 1.2 mg/dL   GFR calc non Af Amer >60 >60 mL/min   GFR calc Af Amer >60 >60 mL/min   Anion gap 8 5 - 15      Assessment & Plan:   Encounter Diagnoses  Name Primary?  . Type 2 diabetes mellitus without complication, without long-term current use of insulin (HCC) Yes  . Essential hypertension   . Hyperlipidemia, unspecified hyperlipidemia type     -reviewed labs with pt -pt counseled to remember all meds when he goes out of town -pt to follow up 3 months.  RTO sooner prn

## 2018-02-28 ENCOUNTER — Other Ambulatory Visit: Payer: Self-pay | Admitting: Physician Assistant

## 2018-05-24 ENCOUNTER — Other Ambulatory Visit (HOSPITAL_COMMUNITY)
Admission: RE | Admit: 2018-05-24 | Discharge: 2018-05-24 | Disposition: A | Discharge status: Home / Self Care | Payer: Self-pay | Source: Ambulatory Visit | Attending: Physician Assistant | Admitting: Physician Assistant

## 2018-05-24 DIAGNOSIS — E785 Hyperlipidemia, unspecified: Secondary | ICD-10-CM | POA: Insufficient documentation

## 2018-05-24 DIAGNOSIS — E119 Type 2 diabetes mellitus without complications: Secondary | ICD-10-CM | POA: Insufficient documentation

## 2018-05-24 DIAGNOSIS — I10 Essential (primary) hypertension: Secondary | ICD-10-CM | POA: Insufficient documentation

## 2018-05-24 LAB — COMPREHENSIVE METABOLIC PANEL
ALBUMIN: 4.1 g/dL (ref 3.5–5.0)
ALT: 33 U/L (ref 0–44)
ANION GAP: 8 (ref 5–15)
AST: 25 U/L (ref 15–41)
Alkaline Phosphatase: 72 U/L (ref 38–126)
BILIRUBIN TOTAL: 0.8 mg/dL (ref 0.3–1.2)
BUN: 13 mg/dL (ref 6–20)
CO2: 28 mmol/L (ref 22–32)
Calcium: 9 mg/dL (ref 8.9–10.3)
Chloride: 102 mmol/L (ref 98–111)
Creatinine, Ser: 0.96 mg/dL (ref 0.61–1.24)
GFR calc Af Amer: >60 mL/min (ref >60)
GFR calc non Af Amer: >60 mL/min (ref >60)
GLUCOSE: 179 mg/dL — AB (ref 70–99)
POTASSIUM: 4.3 mmol/L (ref 3.5–5.1)
SODIUM: 138 mmol/L (ref 135–145)
TOTAL PROTEIN: 7.4 g/dL (ref 6.5–8.1)

## 2018-05-24 LAB — LIPID PANEL
CHOL/HDL RATIO: 4.4 ratio
CHOLESTEROL: 148 mg/dL (ref 0–200)
HDL: 34 mg/dL — ABNORMAL LOW (ref >40)
LDL CALC: 94 mg/dL (ref 0–99)
Triglycerides: 99 mg/dL (ref <150)
VLDL: 20 mg/dL (ref 0–40)

## 2018-05-24 LAB — HEMOGLOBIN A1C
Hgb A1c MFr Bld: 7 % — ABNORMAL HIGH (ref 4.8–5.6)
Mean Plasma Glucose: 154.2 mg/dL

## 2018-05-30 ENCOUNTER — Other Ambulatory Visit: Payer: Self-pay | Admitting: Physician Assistant

## 2018-05-30 ENCOUNTER — Encounter: Payer: Self-pay | Admitting: Physician Assistant

## 2018-05-30 ENCOUNTER — Ambulatory Visit: Payer: Self-pay | Admitting: Physician Assistant

## 2018-05-30 VITALS — BP 144/88 | HR 88 | Temp 98.1°F | Ht 70.0 in | Wt 255.5 lb

## 2018-05-30 DIAGNOSIS — E119 Type 2 diabetes mellitus without complications: Secondary | ICD-10-CM

## 2018-05-30 DIAGNOSIS — I10 Essential (primary) hypertension: Secondary | ICD-10-CM

## 2018-05-30 DIAGNOSIS — E785 Hyperlipidemia, unspecified: Secondary | ICD-10-CM

## 2018-05-30 MED ORDER — SITAGLIPTIN PHOSPHATE 100 MG PO TABS: 100.0000 mg | ORAL_TABLET | Freq: Every day | ORAL | 1 refills | Status: AC

## 2018-05-30 MED ORDER — LISINOPRIL 20 MG PO TABS: 20.0000 mg | ORAL_TABLET | Freq: Every day | ORAL | 1 refills | Status: DC

## 2018-05-30 MED ORDER — SITAGLIPTIN PHOSPHATE 100 MG PO TABS: 100.0000 mg | ORAL_TABLET | Freq: Every day | ORAL | 1 refills | Status: DC

## 2018-05-30 NOTE — Progress Notes (Signed)
BP (!) 144/88 (BP Location: Right Arm, Patient Position: Sitting, Cuff Size: Large)   Pulse 88   Temp 98.1 F (36.7 C) (Other (Comment))   Ht 5\' 10"  (1.778 m)   Wt 255 lb 8 oz (115.9 kg)   SpO2 97%   BMI 36.66 kg/m    Subjective:    Patient ID: Marcus Arellano, male    DOB: 12/24/1975, 42 y.o.   MRN: [id number]  HPI: Marcus Arellano is a 42 y.o. male presenting on 05/30/2018 for Diabetes; Hypertension; and Hyperlipidemia   HPI  Pt is doing well today. He ran out of two of his meds about a week ago.   Relevant past medical, surgical, family and social history reviewed and updated as indicated. Interim medical history since our last visit reviewed. Allergies and medications reviewed and updated.   Current Outpatient Medications:  .  atorvastatin (LIPITOR) 20 MG tablet, Take 1 tablet (20 mg total) by mouth daily., Disp: 90 tablet, Rfl: 1 .  BABY ASPIRIN PO, Take 81 mg by mouth daily. , Disp: , Rfl:  .  metFORMIN (GLUCOPHAGE) 1000 MG tablet, Take 1 tablet (1,000 mg total) by mouth 2 (two) times daily with a meal., Disp: 180 tablet, Rfl: 1 .  metoprolol tartrate (LOPRESSOR) 50 MG tablet, Take 1 tablet (50 mg total) by mouth 2 (two) times daily., Disp: 60 tablet, Rfl: 3 .  lisinopril (PRINIVIL,ZESTRIL) 20 MG tablet, TAKE 1 TABLET BY MOUTH ONCE DAILY (Patient not taking: Reported on 05/30/2018), Disp: 30 tablet, Rfl: 3 .  sitaGLIPtin (JANUVIA) 100 MG tablet, Take 1 tablet (100 mg total) by mouth daily. (Patient not taking: Reported on 05/30/2018), Disp: 90 tablet, Rfl: 1  Review of Systems  Constitutional: Negative for appetite change, chills, diaphoresis, fatigue, fever and unexpected weight change.  HENT: Negative for congestion, drooling, ear pain, facial swelling, hearing loss, mouth sores, sneezing, sore throat, trouble swallowing and voice change.   Eyes: Negative for pain, discharge, redness, itching and visual disturbance.  Respiratory: Negative for cough, choking, shortness of  breath and wheezing.   Cardiovascular: Negative for chest pain, palpitations and leg swelling.  Gastrointestinal: Negative for abdominal pain, blood in stool, constipation, diarrhea and vomiting.  Endocrine: Negative for cold intolerance, heat intolerance and polydipsia.  Genitourinary: Negative for decreased urine volume, dysuria and hematuria.  Musculoskeletal: Negative for arthralgias, back pain and gait problem.  Skin: Negative for rash.  Allergic/Immunologic: Negative for environmental allergies.  Neurological: Negative for seizures, syncope, light-headedness and headaches.  Hematological: Negative for adenopathy.  Psychiatric/Behavioral: Negative for agitation, dysphoric mood and suicidal ideas. The patient is not nervous/anxious.     Per HPI unless specifically indicated above     Objective:    BP (!) 144/88 (BP Location: Right Arm, Patient Position: Sitting, Cuff Size: Large)   Pulse 88   Temp 98.1 F (36.7 C) (Other (Comment))   Ht 5\' 10"  (1.778 m)   Wt 255 lb 8 oz (115.9 kg)   SpO2 97%   BMI 36.66 kg/m   Wt Readings from Last 3 Encounters:  05/30/18 255 lb 8 oz (115.9 kg)  02/27/18 253 lb (114.8 kg)  01/28/18 251 lb 8 oz (114.1 kg)    Physical Exam  Constitutional: He is oriented to person, place, and time. He appears well-developed and well-nourished.  HENT:  Head: Normocephalic and atraumatic.  Neck: Neck supple.  Cardiovascular: Normal rate and regular rhythm.  Pulmonary/Chest: Effort normal and breath sounds normal. He has no wheezes.  Abdominal: Soft. Bowel  sounds are normal. There is no hepatosplenomegaly. There is no tenderness.  Musculoskeletal: He exhibits no edema.  Lymphadenopathy:    He has no cervical adenopathy.  Neurological: He is alert and oriented to person, place, and time.  Skin: Skin is warm and dry.  Psychiatric: He has a normal mood and affect. His behavior is normal.  Vitals reviewed.   Results for orders placed or performed during  the hospital encounter of 05/24/18  Hemoglobin A1c  Result Value Ref Range   Hgb A1c MFr Bld 7.0 (H) 4.8 - 5.6 %   Mean Plasma Glucose 154.2 mg/dL  Lipid panel  Result Value Ref Range   Cholesterol 148 0 - 200 mg/dL   Triglycerides 99 <409 mg/dL   HDL 34 (L) >81 mg/dL   Total CHOL/HDL Ratio 4.4 RATIO   VLDL 20 0 - 40 mg/dL   LDL Cholesterol 94 0 - 99 mg/dL  Comprehensive metabolic panel  Result Value Ref Range   Sodium 138 135 - 145 mmol/L   Potassium 4.3 3.5 - 5.1 mmol/L   Chloride 102 98 - 111 mmol/L   CO2 28 22 - 32 mmol/L   Glucose, Bld 179 (H) 70 - 99 mg/dL   BUN 13 6 - 20 mg/dL   Creatinine, Ser 1.91 0.61 - 1.24 mg/dL   Calcium 9.0 8.9 - 47.8 mg/dL   Total Protein 7.4 6.5 - 8.1 g/dL   Albumin 4.1 3.5 - 5.0 g/dL   AST 25 15 - 41 U/L   ALT 33 0 - 44 U/L   Alkaline Phosphatase 72 38 - 126 U/L   Total Bilirubin 0.8 0.3 - 1.2 mg/dL   GFR calc non Af Amer >60 >60 mL/min   GFR calc Af Amer >60 >60 mL/min   Anion gap 8 5 - 15      Assessment & Plan:    Encounter Diagnoses  Name Primary?  . Type 2 diabetes mellitus without complication, without long-term current use of insulin (HCC) Yes  . Essential hypertension   . Hyperlipidemia, unspecified hyperlipidemia type     -reviewed labs with pt -pt to continue current medications.  Pt counseled to avoid running out of meds -pt is on list for diabetic eye exam -pt to follow up 3 months.  RTO sooner prn

## 2018-06-17 ENCOUNTER — Encounter: Payer: Self-pay | Admitting: Physician Assistant

## 2018-08-27 ENCOUNTER — Other Ambulatory Visit (HOSPITAL_COMMUNITY)
Admission: RE | Admit: 2018-08-27 | Discharge: 2018-08-27 | Disposition: A | Discharge status: Home / Self Care | Payer: Medicaid Other | Source: Ambulatory Visit | Attending: Physician Assistant | Admitting: Physician Assistant

## 2018-08-27 DIAGNOSIS — R69 Illness, unspecified: Secondary | ICD-10-CM | POA: Diagnosis present

## 2018-08-27 LAB — LIPID PANEL
CHOL/HDL RATIO: 3.8 ratio
CHOLESTEROL: 129 mg/dL (ref 0–200)
HDL: 34 mg/dL — AB (ref >40)
LDL Cholesterol: 73 mg/dL (ref 0–99)
TRIGLYCERIDES: 112 mg/dL (ref <150)
VLDL: 22 mg/dL (ref 0–40)

## 2018-08-27 LAB — COMPREHENSIVE METABOLIC PANEL
ALK PHOS: 72 U/L (ref 38–126)
ALT: 24 U/L (ref 0–44)
ANION GAP: 7 (ref 5–15)
AST: 22 U/L (ref 15–41)
Albumin: 4.2 g/dL (ref 3.5–5.0)
BUN: 14 mg/dL (ref 6–20)
CO2: 27 mmol/L (ref 22–32)
Calcium: 9.2 mg/dL (ref 8.9–10.3)
Chloride: 103 mmol/L (ref 98–111)
Creatinine, Ser: 0.95 mg/dL (ref 0.61–1.24)
GFR calc Af Amer: >60 mL/min (ref >60)
GFR calc non Af Amer: >60 mL/min (ref >60)
GLUCOSE: 147 mg/dL — AB (ref 70–99)
POTASSIUM: 4.2 mmol/L (ref 3.5–5.1)
SODIUM: 137 mmol/L (ref 135–145)
Total Bilirubin: 0.7 mg/dL (ref 0.3–1.2)
Total Protein: 7.7 g/dL (ref 6.5–8.1)

## 2018-08-27 LAB — HEMOGLOBIN A1C
Hgb A1c MFr Bld: 7.5 % — ABNORMAL HIGH (ref 4.8–5.6)
Mean Plasma Glucose: 168.55 mg/dL

## 2018-08-28 LAB — MICROALBUMIN, URINE: MICROALB UR: 147.2 ug/mL — AB

## 2018-09-02 ENCOUNTER — Ambulatory Visit: Payer: Self-pay | Admitting: Physician Assistant

## 2018-09-04 ENCOUNTER — Ambulatory Visit: Payer: Medicaid Other | Admitting: Physician Assistant

## 2018-09-04 ENCOUNTER — Encounter: Payer: Self-pay | Admitting: Physician Assistant

## 2018-09-04 VITALS — BP 141/92 | HR 81 | Temp 97.7°F | Ht 70.0 in | Wt 256.0 lb

## 2018-09-04 DIAGNOSIS — I10 Essential (primary) hypertension: Secondary | ICD-10-CM

## 2018-09-04 DIAGNOSIS — E785 Hyperlipidemia, unspecified: Secondary | ICD-10-CM

## 2018-09-04 DIAGNOSIS — E119 Type 2 diabetes mellitus without complications: Secondary | ICD-10-CM

## 2018-09-04 NOTE — Progress Notes (Signed)
BP (!) 141/92 (BP Location: Right Arm, Patient Position: Sitting, Cuff Size: Large)   Pulse 81   Temp 97.7 F (36.5 C)   Ht 5\' 10"  (1.778 m)   Wt 256 lb (116.1 kg)   SpO2 90%   BMI 36.73 kg/m    Subjective:    Patient ID: Marcus Arellano, male    DOB: 1976-07-06, 42 y.o.   MRN: [id number]  HPI: Marcus Arellano is a 42 y.o. male presenting on 09/04/2018 for Hypertension   HPI  Pt had been out of his meds for several weeks and just got back on it .   Pt says he has full medicaid  Pt says he is doing well today and has no complaints.   Relevant past medical, surgical, family and social history reviewed and updated as indicated. Interim medical history since our last visit reviewed. Allergies and medications reviewed and updated.   Current Outpatient Medications:  .  atorvastatin (LIPITOR) 20 MG tablet, Take 1 tablet (20 mg total) by mouth daily., Disp: 90 tablet, Rfl: 1 .  BABY ASPIRIN PO, Take 81 mg by mouth daily. , Disp: , Rfl:  .  lisinopril (PRINIVIL,ZESTRIL) 20 MG tablet, TAKE 1 TABLET BY MOUTH ONCE DAILY, Disp: 30 tablet, Rfl: 1 .  metFORMIN (GLUCOPHAGE) 1000 MG tablet, Take 1 tablet (1,000 mg total) by mouth 2 (two) times daily with a meal., Disp: 180 tablet, Rfl: 1 .  metoprolol tartrate (LOPRESSOR) 50 MG tablet, Take 1 tablet (50 mg total) by mouth 2 (two) times daily., Disp: 60 tablet, Rfl: 3 .  sitaGLIPtin (JANUVIA) 100 MG tablet, Take 1 tablet (100 mg total) by mouth daily., Disp: 90 tablet, Rfl: 1  Review of Systems  Constitutional: Negative for appetite change, chills, diaphoresis, fatigue, fever and unexpected weight change.  HENT: Negative for congestion, dental problem, drooling, ear pain, facial swelling, hearing loss, mouth sores, sneezing, sore throat, trouble swallowing and voice change.   Eyes: Negative for pain, discharge, redness, itching and visual disturbance.  Respiratory: Negative for cough, choking, shortness of breath and wheezing.   Cardiovascular:  Negative for chest pain, palpitations and leg swelling.  Gastrointestinal: Negative for abdominal pain, blood in stool, constipation, diarrhea and vomiting.  Endocrine: Negative for cold intolerance, heat intolerance and polydipsia.  Genitourinary: Negative for decreased urine volume, dysuria and hematuria.  Musculoskeletal: Negative for arthralgias, back pain and gait problem.  Skin: Negative for rash.  Allergic/Immunologic: Negative for environmental allergies.  Neurological: Negative for seizures, syncope, light-headedness and headaches.  Hematological: Negative for adenopathy.  Psychiatric/Behavioral: Negative for agitation, dysphoric mood and suicidal ideas. The patient is not nervous/anxious.     Per HPI unless specifically indicated above     Objective:    BP (!) 141/92 (BP Location: Right Arm, Patient Position: Sitting, Cuff Size: Large)   Pulse 81   Temp 97.7 F (36.5 C)   Ht 5\' 10"  (1.778 m)   Wt 256 lb (116.1 kg)   SpO2 90%   BMI 36.73 kg/m   Wt Readings from Last 3 Encounters:  09/04/18 256 lb (116.1 kg)  05/30/18 255 lb 8 oz (115.9 kg)  02/27/18 253 lb (114.8 kg)    Physical Exam  Constitutional: He is oriented to person, place, and time. He appears well-developed and well-nourished.  HENT:  Head: Normocephalic and atraumatic.  Neck: Neck supple.  Cardiovascular: Normal rate and regular rhythm.  Pulmonary/Chest: Effort normal and breath sounds normal. He has no wheezes.  Abdominal: Soft. Bowel sounds are normal.  There is no hepatosplenomegaly. There is no tenderness.  Musculoskeletal: He exhibits no edema.  Lymphadenopathy:    He has no cervical adenopathy.  Neurological: He is alert and oriented to person, place, and time.  Skin: Skin is warm and dry.  Psychiatric: He has a normal mood and affect. His behavior is normal.  Vitals reviewed.   Results for orders placed or performed during the hospital encounter of 08/27/18  Comprehensive metabolic panel   Result Value Ref Range   Sodium 137 135 - 145 mmol/L   Potassium 4.2 3.5 - 5.1 mmol/L   Chloride 103 98 - 111 mmol/L   CO2 27 22 - 32 mmol/L   Glucose, Bld 147 (H) 70 - 99 mg/dL   BUN 14 6 - 20 mg/dL   Creatinine, Ser 1.61 0.61 - 1.24 mg/dL   Calcium 9.2 8.9 - 09.6 mg/dL   Total Protein 7.7 6.5 - 8.1 g/dL   Albumin 4.2 3.5 - 5.0 g/dL   AST 22 15 - 41 U/L   ALT 24 0 - 44 U/L   Alkaline Phosphatase 72 38 - 126 U/L   Total Bilirubin 0.7 0.3 - 1.2 mg/dL   GFR calc non Af Amer >60 >60 mL/min   GFR calc Af Amer >60 >60 mL/min   Anion gap 7 5 - 15  Microalbumin, urine  Result Value Ref Range   Microalb, Ur 147.2 (H) Not Estab. ug/mL  Hemoglobin A1c  Result Value Ref Range   Hgb A1c MFr Bld 7.5 (H) 4.8 - 5.6 %   Mean Plasma Glucose 168.55 mg/dL  Lipid panel  Result Value Ref Range   Cholesterol 129 0 - 200 mg/dL   Triglycerides 045 <409 mg/dL   HDL 34 (L) >81 mg/dL   Total CHOL/HDL Ratio 3.8 RATIO   VLDL 22 0 - 40 mg/dL   LDL Cholesterol 73 0 - 99 mg/dL      Assessment & Plan:   Encounter Diagnoses  Name Primary?  . Type 2 diabetes mellitus without complication, without long-term current use of insulin (HCC) Yes  . Essential hypertension   . Hyperlipidemia, unspecified hyperlipidemia type     -reviewed labs with pt -Pt counseled to watch diabetic diet.  He says he doesn't need his meds adjusted, he just hasn't been watching what he eats -pt counseled to Avoid running out of meds -Discussed getting new PCP now that he has full medicaid.  Pt says he has plenty of meds to last and is not in ned of a refill

## 2018-11-21 ENCOUNTER — Other Ambulatory Visit: Payer: Self-pay

## 2018-11-21 ENCOUNTER — Emergency Department (HOSPITAL_COMMUNITY)
Admission: EM | Admit: 2018-11-21 | Discharge: 2018-11-22 | Disposition: A | Discharge status: Home / Self Care | Payer: Medicaid Other | Attending: Emergency Medicine | Admitting: Emergency Medicine

## 2018-11-21 ENCOUNTER — Encounter (HOSPITAL_COMMUNITY): Payer: Self-pay | Admitting: Emergency Medicine

## 2018-11-21 DIAGNOSIS — I10 Essential (primary) hypertension: Secondary | ICD-10-CM | POA: Diagnosis not present

## 2018-11-21 DIAGNOSIS — Z87891 Personal history of nicotine dependence: Secondary | ICD-10-CM | POA: Diagnosis not present

## 2018-11-21 DIAGNOSIS — R05 Cough: Secondary | ICD-10-CM | POA: Diagnosis present

## 2018-11-21 DIAGNOSIS — Z7982 Long term (current) use of aspirin: Secondary | ICD-10-CM | POA: Diagnosis not present

## 2018-11-21 DIAGNOSIS — J069 Acute upper respiratory infection, unspecified: Secondary | ICD-10-CM | POA: Insufficient documentation

## 2018-11-21 DIAGNOSIS — Z7984 Long term (current) use of oral hypoglycemic drugs: Secondary | ICD-10-CM | POA: Insufficient documentation

## 2018-11-21 DIAGNOSIS — Z79899 Other long term (current) drug therapy: Secondary | ICD-10-CM | POA: Diagnosis not present

## 2018-11-21 DIAGNOSIS — E119 Type 2 diabetes mellitus without complications: Secondary | ICD-10-CM | POA: Insufficient documentation

## 2018-11-21 DIAGNOSIS — B9789 Other viral agents as the cause of diseases classified elsewhere: Secondary | ICD-10-CM

## 2018-11-21 LAB — CBG MONITORING, ED: Glucose-Capillary: 123 mg/dL — ABNORMAL HIGH (ref 70–99)

## 2018-11-21 MED ORDER — ALBUTEROL SULFATE (2.5 MG/3ML) 0.083% IN NEBU
2.5000 mg | INHALATION_SOLUTION | Freq: Once | RESPIRATORY_TRACT | Status: AC
  Administered 2018-11-21: 2.5 mg via RESPIRATORY_TRACT
  Filled 2018-11-21: qty 3

## 2018-11-21 MED ORDER — ACETAMINOPHEN 325 MG PO TABS
650.0000 mg | ORAL_TABLET | Freq: Once | ORAL | Status: AC
  Administered 2018-11-22: 650 mg via ORAL
  Filled 2018-11-21: qty 2

## 2018-11-21 MED ORDER — BENZONATATE 100 MG PO CAPS
200.0000 mg | ORAL_CAPSULE | Freq: Once | ORAL | Status: AC
  Administered 2018-11-22: 200 mg via ORAL
  Filled 2018-11-21: qty 2

## 2018-11-21 MED ORDER — ALBUTEROL SULFATE HFA 108 (90 BASE) MCG/ACT IN AERS
2.0000 | INHALATION_SPRAY | Freq: Once | RESPIRATORY_TRACT | Status: AC
  Administered 2018-11-21: 2 via RESPIRATORY_TRACT
  Filled 2018-11-21: qty 6.7

## 2018-11-21 NOTE — ED Triage Notes (Signed)
Pt c/o productive cough, sore throat and head congestion since Monday.

## 2018-11-22 MED ORDER — MAGIC MOUTHWASH W/LIDOCAINE: 5.0000 mL | Freq: Three times a day (TID) | ORAL | 0 refills | Status: AC | PRN

## 2018-11-22 MED ORDER — BENZONATATE 200 MG PO CAPS: 200.0000 mg | ORAL_CAPSULE | Freq: Three times a day (TID) | ORAL | 0 refills | Status: AC | PRN

## 2018-11-22 NOTE — Discharge Instructions (Addendum)
1 to 2 puffs of the albuterol inhaler every 4-6 hours as needed.  Drink plenty of fluids.  Take over-the-counter Tylenol every 4 hours for body aches and/or fever.  Follow-up with your primary doctor for recheck, return to ER for any worsening symptoms.

## 2018-11-22 NOTE — ED Provider Notes (Signed)
[facility] EMERGENCY DEPARTMENT Provider Note   CSN: [id number] Arrival date & time: 11/21/18  2331    History   Chief Complaint Chief Complaint  Patient presents with  . Cough    HPI Marcus Arellano is a 43 y.o. male.     HPI   Marcus Arellano is a 43 y.o. male with hx of DM, HTN who presents to the Emergency Department complaining of cough, sore throat, nasal congestion.  Symptoms began three days ago.  He describes cough as productive of yellow, thick sputum and associated with some tightness to his upper chest with lying supine and excessive cough.  Low grade fever for two days.  Last tylenol dose yesterday.  He denies vomiting, chest pain, shortness of breath, body aches and headache.  States his girlfriend also had similar symptoms recently.    Past Medical History:  Diagnosis Date  . Diabetes mellitus without complication (HCC)    onset 2018  . Hypertension   . Seizures [facility]) age 71   during childhood    Patient Active Problem List   Diagnosis Date Noted  . Hyperlipidemia 09/11/2017  . Uncontrolled type 2 diabetes mellitus with hyperglycemia (HCC) 08/09/2017  . Essential hypertension 08/09/2017    History reviewed. No pertinent surgical history.    Home Medications    Prior to Admission medications   Medication Sig Start Date End Date Taking? Authorizing Provider  atorvastatin (LIPITOR) 20 MG tablet Take 1 tablet (20 mg total) by mouth daily. 12/20/17   Jacquelin Hawking, PA-C  BABY ASPIRIN PO Take 81 mg by mouth daily.     [provider]  lisinopril (PRINIVIL,ZESTRIL) 20 MG tablet TAKE 1 TABLET BY MOUTH ONCE DAILY 05/30/18   Jacquelin Hawking, PA-C  metFORMIN (GLUCOPHAGE) 1000 MG tablet Take 1 tablet (1,000 mg total) by mouth 2 (two) times daily with a meal. 12/20/17   Jacquelin Hawking, PA-C  metoprolol tartrate (LOPRESSOR) 50 MG tablet Take 1 tablet (50 mg total) by mouth 2 (two) times daily. 01/28/18   Jacquelin Hawking, PA-C  sitaGLIPtin (JANUVIA) 100 MG  tablet Take 1 tablet (100 mg total) by mouth daily. 05/30/18   Jacquelin Hawking, PA-C    Family History Family History  Problem Relation Age of Onset  . Hypertension Mother   . Hypertension Father   . Diabetes Maternal Aunt   . Diabetes Maternal Uncle   . Heart disease Maternal Uncle     Social History Social History   Tobacco Use  . Smoking status: Former Smoker    Packs/day: 0.25    Years: 14.00    Pack years: 3.50    Types: Cigarettes    Last attempt to quit: 2008    Years since quitting: 12.1  . Smokeless tobacco: Never Used  Substance Use Topics  . Alcohol use: Yes    Comment: ocassionally "special occassions"  . Drug use: No    Types: Marijuana    Comment: last marijuana use 08/2016     Allergies   Patient has no known allergies.   Review of Systems Review of Systems  Constitutional: Positive for chills and fever. Negative for activity change and appetite change.  HENT: Positive for congestion, rhinorrhea and sore throat. Negative for ear pain, facial swelling, trouble swallowing and voice change.   Eyes: Negative for pain and visual disturbance.  Respiratory: Positive for cough, chest tightness and wheezing. Negative for shortness of breath.   Cardiovascular: Negative for chest pain and leg swelling.  Gastrointestinal: Negative for abdominal pain,  nausea and vomiting.  Genitourinary: Negative for decreased urine volume and dysuria.  Musculoskeletal: Negative for arthralgias, myalgias, neck pain and neck stiffness.  Skin: Negative for color change and rash.  Neurological: Negative for dizziness, facial asymmetry, speech difficulty, numbness and headaches.  Hematological: Negative for adenopathy.     Physical Exam Updated Vital Signs BP (!) 150/81 (BP Location: Left Arm)   Pulse 93   Temp 100 F (37.8 C) (Oral)   Resp 20   Ht 5\' 10"  (1.778 m)   Wt 113.4 kg   SpO2 97%   BMI 35.87 kg/m   Physical Exam Vitals signs and nursing note reviewed.    Constitutional:      General: He is not in acute distress.    Appearance: Normal appearance. He is not ill-appearing.  HENT:     Right Ear: Tympanic membrane and ear canal normal.     Left Ear: Tympanic membrane and ear canal normal.     Nose: Rhinorrhea present.     Mouth/Throat:     Mouth: Mucous membranes are moist.     Pharynx: Oropharynx is clear. No oropharyngeal exudate.  Neck:     Musculoskeletal: Normal range of motion. No neck rigidity.  Cardiovascular:     Rate and Rhythm: Normal rate and regular rhythm.     Pulses: Normal pulses.  Pulmonary:     Effort: Pulmonary effort is normal. No respiratory distress.     Breath sounds: Wheezing present.     Comments: Few expiratory wheezes, no rales.   Abdominal:     General: There is no distension.     Palpations: Abdomen is soft.     Tenderness: There is no abdominal tenderness.  Musculoskeletal: Normal range of motion.        General: No swelling.  Skin:    General: Skin is warm.     Capillary Refill: Capillary refill takes less than 2 seconds.     Findings: No rash.  Neurological:     General: No focal deficit present.     Mental Status: He is alert.  Psychiatric:        Mood and Affect: Mood normal.      ED Treatments / Results  Labs (all labs ordered are listed, but only abnormal results are displayed) Labs Reviewed  CBG MONITORING, ED - Abnormal; Notable for the following components:      Result Value   Glucose-Capillary 123 (*)    All other components within normal limits    EKG None  Radiology No results found.  Procedures Procedures (including critical care time)  Medications Ordered in ED Medications  acetaminophen (TYLENOL) tablet 650 mg (has no administration in time range)  benzonatate (TESSALON) capsule 200 mg (has no administration in time range)  albuterol (PROVENTIL) (2.5 MG/3ML) 0.083% nebulizer solution 2.5 mg (2.5 mg Nebulization Given 11/21/18 2357)  albuterol (PROVENTIL  HFA;VENTOLIN HFA) 108 (90 Base) MCG/ACT inhaler 2 puff (2 puffs Inhalation Given 11/21/18 2357)     Initial Impression / Assessment and Plan / ED Course  I have reviewed the triage vital signs and the nursing notes.  Pertinent labs & imaging results that were available during my care of the patient were reviewed by me and considered in my medical decision making (see chart for details).       0030 on recheck, patient reports feeling better after receiving albuterol neb.  No tachycardia tachypnea or hypoxia.  I feel that symptoms are likely viral.  Patient appears appropriate for  discharge home, he agrees to albuterol MDI dispensed for home use, Tessalon and Magic mouthwash. Return precautions discussed.    Final Clinical Impressions(s) / ED Diagnoses   Final diagnoses:  None    ED Discharge Orders    None       Pauline Ausriplett, Burnis Halling, PA-C 11/22/18 0038    Mesner, Barbara CowerJason, MD 11/22/18 [phone]
